# Patient Record
Sex: Male | Born: 1940 | Race: White | Hispanic: No | Marital: Married | Smoking: Former smoker
Health system: Southern US, Community
[De-identification: ages and names within clinical notes are randomized; demographics above are authoritative.]

## PROBLEM LIST (undated history)

## (undated) DIAGNOSIS — E785 Hyperlipidemia, unspecified: Secondary | ICD-10-CM

## (undated) DIAGNOSIS — I10 Essential (primary) hypertension: Secondary | ICD-10-CM

## (undated) HISTORY — PX: HERNIA REPAIR: SHX51

---

## 2001-06-18 ENCOUNTER — Encounter: Payer: Self-pay | Admitting: Emergency Medicine

## 2001-06-18 ENCOUNTER — Emergency Department (HOSPITAL_COMMUNITY): Admission: EM | Admit: 2001-06-18 | Discharge: 2001-06-18 | Payer: Self-pay | Admitting: Emergency Medicine

## 2009-03-06 ENCOUNTER — Encounter: Admission: RE | Admit: 2009-03-06 | Discharge: 2009-03-06 | Payer: Self-pay | Admitting: Endocrinology

## 2010-09-03 NOTE — Consult Note (Signed)
[place]. [facility]  Patient:    Caleb Johnson, Caleb Johnson Visit Number: [id number] MRN: [id number]          Service Type: EMS Location: [place] Attending Physician:  Devoria Albe Dictated by:   Artist Pais Mina Marble, M.D. Proc. Date: 06/18/01 Admit Date:  06/18/2001                            Consultation Report  REQUESTING PHYSICIAN:  Ward Givens, M.D.  REASON FOR CONSULTATION:  Caleb Johnson is a 70 year old right-hand dominant male who presents today with acute amputations of his long ring finger on his dominant right hand status post moving some large logs with a rope wrapped around a lawnmower engine.  He is an otherwise fairly healthy 70 year old male.  ALLERGIES:  No known drug allergies.  CURRENT MEDICATIONS:  Toprol, Monopril, Pravachol, ______, aspirin q.d.  PAST MEDICAL HISTORY:  He is status post a heart attack in 1996.  He did not have any surgery.  No recent hospitalizations or surgery.  FAMILY HISTORY:  Significant for heart disease and hypertension.  SOCIAL HISTORY:  Noncontributory.  PHYSICAL EXAMINATION  GENERAL:  Well-developed, well-nourished male.  Pleasant, alert x3.  EXTREMITIES:  Hand on the right:  He has obvious amputations to the distal aspect of his long and ring finger.  His long finger at the DIP level amputation.  His ring finger is partial amputation through the nail plate and nail bed with loss of the nail plate.  The rest of his hand examination is normal.  LABORATORIES:  X-rays show the above mentioned amputation.  PROCEDURE:  The patient was anesthetized in the emergency department using 2% plain lidocaine. His right hand was then prepped and draped in the usual sterile fashion.  Once this was done revision of the amputations with volar V-Y flaps were undertaken for both the long and ring fingers on his right hand to cover the exposed distal phalangeal bone.  Patient was then dressed with Xeroform, 4 x 4s, Coban wrap.  Was  discharged from the emergency department with Keflex 500 mg one p.o. q.i.d. for a week of antibiotic prophylaxis, Vicodin for pain.  Follow up in my office in five to seven days. Dictated by:   Artist Pais Mina Marble, M.D. Attending Physician:  Devoria Albe DD:  06/18/01 TD:  06/19/01 Job: [id number]

## 2011-06-21 DIAGNOSIS — E782 Mixed hyperlipidemia: Secondary | ICD-10-CM | POA: Diagnosis not present

## 2011-06-21 DIAGNOSIS — I2510 Atherosclerotic heart disease of native coronary artery without angina pectoris: Secondary | ICD-10-CM | POA: Diagnosis not present

## 2011-06-21 DIAGNOSIS — E669 Obesity, unspecified: Secondary | ICD-10-CM | POA: Diagnosis not present

## 2011-06-21 DIAGNOSIS — I10 Essential (primary) hypertension: Secondary | ICD-10-CM | POA: Diagnosis not present

## 2011-06-28 DIAGNOSIS — R7309 Other abnormal glucose: Secondary | ICD-10-CM | POA: Diagnosis not present

## 2011-09-28 DIAGNOSIS — E119 Type 2 diabetes mellitus without complications: Secondary | ICD-10-CM | POA: Diagnosis not present

## 2011-10-27 DIAGNOSIS — D485 Neoplasm of uncertain behavior of skin: Secondary | ICD-10-CM | POA: Diagnosis not present

## 2011-12-27 DIAGNOSIS — R7301 Impaired fasting glucose: Secondary | ICD-10-CM | POA: Diagnosis not present

## 2011-12-27 DIAGNOSIS — E663 Overweight: Secondary | ICD-10-CM | POA: Diagnosis not present

## 2011-12-27 DIAGNOSIS — H9190 Unspecified hearing loss, unspecified ear: Secondary | ICD-10-CM | POA: Diagnosis not present

## 2011-12-27 DIAGNOSIS — E782 Mixed hyperlipidemia: Secondary | ICD-10-CM | POA: Diagnosis not present

## 2011-12-27 DIAGNOSIS — I10 Essential (primary) hypertension: Secondary | ICD-10-CM | POA: Diagnosis not present

## 2011-12-27 DIAGNOSIS — I2510 Atherosclerotic heart disease of native coronary artery without angina pectoris: Secondary | ICD-10-CM | POA: Diagnosis not present

## 2011-12-27 DIAGNOSIS — Z0000 Encounter for general adult medical examination without abnormal findings: Secondary | ICD-10-CM | POA: Diagnosis not present

## 2012-01-03 DIAGNOSIS — R0602 Shortness of breath: Secondary | ICD-10-CM | POA: Diagnosis not present

## 2012-01-03 DIAGNOSIS — Z125 Encounter for screening for malignant neoplasm of prostate: Secondary | ICD-10-CM | POA: Diagnosis not present

## 2012-06-26 DIAGNOSIS — I10 Essential (primary) hypertension: Secondary | ICD-10-CM | POA: Diagnosis not present

## 2012-06-26 DIAGNOSIS — E782 Mixed hyperlipidemia: Secondary | ICD-10-CM | POA: Diagnosis not present

## 2012-06-26 DIAGNOSIS — R7301 Impaired fasting glucose: Secondary | ICD-10-CM | POA: Diagnosis not present

## 2012-06-26 DIAGNOSIS — E663 Overweight: Secondary | ICD-10-CM | POA: Diagnosis not present

## 2012-06-26 DIAGNOSIS — G479 Sleep disorder, unspecified: Secondary | ICD-10-CM | POA: Diagnosis not present

## 2012-12-31 DIAGNOSIS — E119 Type 2 diabetes mellitus without complications: Secondary | ICD-10-CM | POA: Diagnosis not present

## 2012-12-31 DIAGNOSIS — E782 Mixed hyperlipidemia: Secondary | ICD-10-CM | POA: Diagnosis not present

## 2012-12-31 DIAGNOSIS — Z23 Encounter for immunization: Secondary | ICD-10-CM | POA: Diagnosis not present

## 2012-12-31 DIAGNOSIS — I10 Essential (primary) hypertension: Secondary | ICD-10-CM | POA: Diagnosis not present

## 2012-12-31 DIAGNOSIS — Z0000 Encounter for general adult medical examination without abnormal findings: Secondary | ICD-10-CM | POA: Diagnosis not present

## 2013-07-01 DIAGNOSIS — E782 Mixed hyperlipidemia: Secondary | ICD-10-CM | POA: Diagnosis not present

## 2013-07-01 DIAGNOSIS — I10 Essential (primary) hypertension: Secondary | ICD-10-CM | POA: Diagnosis not present

## 2013-07-01 DIAGNOSIS — R062 Wheezing: Secondary | ICD-10-CM | POA: Diagnosis not present

## 2013-07-01 DIAGNOSIS — R7301 Impaired fasting glucose: Secondary | ICD-10-CM | POA: Diagnosis not present

## 2013-07-01 DIAGNOSIS — I2510 Atherosclerotic heart disease of native coronary artery without angina pectoris: Secondary | ICD-10-CM | POA: Diagnosis not present

## 2014-01-09 DIAGNOSIS — E663 Overweight: Secondary | ICD-10-CM | POA: Diagnosis not present

## 2014-01-09 DIAGNOSIS — Z0000 Encounter for general adult medical examination without abnormal findings: Secondary | ICD-10-CM | POA: Diagnosis not present

## 2014-01-09 DIAGNOSIS — Z125 Encounter for screening for malignant neoplasm of prostate: Secondary | ICD-10-CM | POA: Diagnosis not present

## 2014-01-09 DIAGNOSIS — E119 Type 2 diabetes mellitus without complications: Secondary | ICD-10-CM | POA: Diagnosis not present

## 2014-01-09 DIAGNOSIS — Z23 Encounter for immunization: Secondary | ICD-10-CM | POA: Diagnosis not present

## 2014-01-09 DIAGNOSIS — Z1331 Encounter for screening for depression: Secondary | ICD-10-CM | POA: Diagnosis not present

## 2014-01-09 DIAGNOSIS — I10 Essential (primary) hypertension: Secondary | ICD-10-CM | POA: Diagnosis not present

## 2014-01-09 DIAGNOSIS — E782 Mixed hyperlipidemia: Secondary | ICD-10-CM | POA: Diagnosis not present

## 2014-01-09 DIAGNOSIS — I2510 Atherosclerotic heart disease of native coronary artery without angina pectoris: Secondary | ICD-10-CM | POA: Diagnosis not present

## 2014-07-10 DIAGNOSIS — E663 Overweight: Secondary | ICD-10-CM | POA: Diagnosis not present

## 2014-07-10 DIAGNOSIS — E782 Mixed hyperlipidemia: Secondary | ICD-10-CM | POA: Diagnosis not present

## 2014-07-10 DIAGNOSIS — E139 Other specified diabetes mellitus without complications: Secondary | ICD-10-CM | POA: Diagnosis not present

## 2014-07-10 DIAGNOSIS — I10 Essential (primary) hypertension: Secondary | ICD-10-CM | POA: Diagnosis not present

## 2014-07-10 DIAGNOSIS — Z6841 Body Mass Index (BMI) 40.0 and over, adult: Secondary | ICD-10-CM | POA: Diagnosis not present

## 2014-07-10 DIAGNOSIS — I2510 Atherosclerotic heart disease of native coronary artery without angina pectoris: Secondary | ICD-10-CM | POA: Diagnosis not present

## 2014-07-28 DIAGNOSIS — L820 Inflamed seborrheic keratosis: Secondary | ICD-10-CM | POA: Diagnosis not present

## 2014-09-08 DIAGNOSIS — D1801 Hemangioma of skin and subcutaneous tissue: Secondary | ICD-10-CM | POA: Diagnosis not present

## 2014-09-08 DIAGNOSIS — L820 Inflamed seborrheic keratosis: Secondary | ICD-10-CM | POA: Diagnosis not present

## 2014-09-08 DIAGNOSIS — L821 Other seborrheic keratosis: Secondary | ICD-10-CM | POA: Diagnosis not present

## 2014-09-08 DIAGNOSIS — D225 Melanocytic nevi of trunk: Secondary | ICD-10-CM | POA: Diagnosis not present

## 2015-02-20 DIAGNOSIS — E663 Overweight: Secondary | ICD-10-CM | POA: Diagnosis not present

## 2015-02-20 DIAGNOSIS — E782 Mixed hyperlipidemia: Secondary | ICD-10-CM | POA: Diagnosis not present

## 2015-02-20 DIAGNOSIS — Z1389 Encounter for screening for other disorder: Secondary | ICD-10-CM | POA: Diagnosis not present

## 2015-02-20 DIAGNOSIS — I2510 Atherosclerotic heart disease of native coronary artery without angina pectoris: Secondary | ICD-10-CM | POA: Diagnosis not present

## 2015-02-20 DIAGNOSIS — Z23 Encounter for immunization: Secondary | ICD-10-CM | POA: Diagnosis not present

## 2015-02-20 DIAGNOSIS — I10 Essential (primary) hypertension: Secondary | ICD-10-CM | POA: Diagnosis not present

## 2015-02-20 DIAGNOSIS — Z0000 Encounter for general adult medical examination without abnormal findings: Secondary | ICD-10-CM | POA: Diagnosis not present

## 2015-02-20 DIAGNOSIS — Z6841 Body Mass Index (BMI) 40.0 and over, adult: Secondary | ICD-10-CM | POA: Diagnosis not present

## 2015-02-20 DIAGNOSIS — E139 Other specified diabetes mellitus without complications: Secondary | ICD-10-CM | POA: Diagnosis not present

## 2015-08-26 DIAGNOSIS — I10 Essential (primary) hypertension: Secondary | ICD-10-CM | POA: Diagnosis not present

## 2015-08-26 DIAGNOSIS — E663 Overweight: Secondary | ICD-10-CM | POA: Diagnosis not present

## 2015-08-26 DIAGNOSIS — E139 Other specified diabetes mellitus without complications: Secondary | ICD-10-CM | POA: Diagnosis not present

## 2015-08-26 DIAGNOSIS — I2510 Atherosclerotic heart disease of native coronary artery without angina pectoris: Secondary | ICD-10-CM | POA: Diagnosis not present

## 2015-08-26 DIAGNOSIS — Z6841 Body Mass Index (BMI) 40.0 and over, adult: Secondary | ICD-10-CM | POA: Diagnosis not present

## 2015-08-26 DIAGNOSIS — E782 Mixed hyperlipidemia: Secondary | ICD-10-CM | POA: Diagnosis not present

## 2015-09-08 DIAGNOSIS — D225 Melanocytic nevi of trunk: Secondary | ICD-10-CM | POA: Diagnosis not present

## 2015-09-08 DIAGNOSIS — L812 Freckles: Secondary | ICD-10-CM | POA: Diagnosis not present

## 2015-09-08 DIAGNOSIS — L821 Other seborrheic keratosis: Secondary | ICD-10-CM | POA: Diagnosis not present

## 2015-09-08 DIAGNOSIS — L570 Actinic keratosis: Secondary | ICD-10-CM | POA: Diagnosis not present

## 2016-04-04 DIAGNOSIS — H524 Presbyopia: Secondary | ICD-10-CM | POA: Diagnosis not present

## 2016-04-04 DIAGNOSIS — H2513 Age-related nuclear cataract, bilateral: Secondary | ICD-10-CM | POA: Diagnosis not present

## 2016-09-06 DIAGNOSIS — L821 Other seborrheic keratosis: Secondary | ICD-10-CM | POA: Diagnosis not present

## 2016-09-06 DIAGNOSIS — L570 Actinic keratosis: Secondary | ICD-10-CM | POA: Diagnosis not present

## 2016-09-06 DIAGNOSIS — D225 Melanocytic nevi of trunk: Secondary | ICD-10-CM | POA: Diagnosis not present

## 2016-12-15 DIAGNOSIS — E663 Overweight: Secondary | ICD-10-CM | POA: Diagnosis not present

## 2016-12-15 DIAGNOSIS — Z6841 Body Mass Index (BMI) 40.0 and over, adult: Secondary | ICD-10-CM | POA: Diagnosis not present

## 2016-12-15 DIAGNOSIS — I10 Essential (primary) hypertension: Secondary | ICD-10-CM | POA: Diagnosis not present

## 2016-12-15 DIAGNOSIS — R7301 Impaired fasting glucose: Secondary | ICD-10-CM | POA: Diagnosis not present

## 2016-12-15 DIAGNOSIS — I2510 Atherosclerotic heart disease of native coronary artery without angina pectoris: Secondary | ICD-10-CM | POA: Diagnosis not present

## 2016-12-15 DIAGNOSIS — E7800 Pure hypercholesterolemia, unspecified: Secondary | ICD-10-CM | POA: Diagnosis not present

## 2017-04-06 DIAGNOSIS — H25013 Cortical age-related cataract, bilateral: Secondary | ICD-10-CM | POA: Diagnosis not present

## 2017-04-06 DIAGNOSIS — H2513 Age-related nuclear cataract, bilateral: Secondary | ICD-10-CM | POA: Diagnosis not present

## 2017-08-18 DIAGNOSIS — I2510 Atherosclerotic heart disease of native coronary artery without angina pectoris: Secondary | ICD-10-CM | POA: Diagnosis not present

## 2017-08-18 DIAGNOSIS — Z0000 Encounter for general adult medical examination without abnormal findings: Secondary | ICD-10-CM | POA: Diagnosis not present

## 2017-08-18 DIAGNOSIS — E1169 Type 2 diabetes mellitus with other specified complication: Secondary | ICD-10-CM | POA: Diagnosis not present

## 2017-08-18 DIAGNOSIS — Z6841 Body Mass Index (BMI) 40.0 and over, adult: Secondary | ICD-10-CM | POA: Diagnosis not present

## 2017-08-18 DIAGNOSIS — E7800 Pure hypercholesterolemia, unspecified: Secondary | ICD-10-CM | POA: Diagnosis not present

## 2017-08-18 DIAGNOSIS — I10 Essential (primary) hypertension: Secondary | ICD-10-CM | POA: Diagnosis not present

## 2017-08-18 DIAGNOSIS — Z1389 Encounter for screening for other disorder: Secondary | ICD-10-CM | POA: Diagnosis not present

## 2017-09-05 DIAGNOSIS — L814 Other melanin hyperpigmentation: Secondary | ICD-10-CM | POA: Diagnosis not present

## 2017-09-05 DIAGNOSIS — L820 Inflamed seborrheic keratosis: Secondary | ICD-10-CM | POA: Diagnosis not present

## 2017-09-05 DIAGNOSIS — D225 Melanocytic nevi of trunk: Secondary | ICD-10-CM | POA: Diagnosis not present

## 2017-09-05 DIAGNOSIS — L853 Xerosis cutis: Secondary | ICD-10-CM | POA: Diagnosis not present

## 2017-09-05 DIAGNOSIS — L570 Actinic keratosis: Secondary | ICD-10-CM | POA: Diagnosis not present

## 2018-02-05 DIAGNOSIS — Z23 Encounter for immunization: Secondary | ICD-10-CM | POA: Diagnosis not present

## 2018-02-21 DIAGNOSIS — E1169 Type 2 diabetes mellitus with other specified complication: Secondary | ICD-10-CM | POA: Diagnosis not present

## 2018-02-21 DIAGNOSIS — E7800 Pure hypercholesterolemia, unspecified: Secondary | ICD-10-CM | POA: Diagnosis not present

## 2018-02-21 DIAGNOSIS — I2510 Atherosclerotic heart disease of native coronary artery without angina pectoris: Secondary | ICD-10-CM | POA: Diagnosis not present

## 2018-04-09 DIAGNOSIS — H25013 Cortical age-related cataract, bilateral: Secondary | ICD-10-CM | POA: Diagnosis not present

## 2018-04-09 DIAGNOSIS — H2513 Age-related nuclear cataract, bilateral: Secondary | ICD-10-CM | POA: Diagnosis not present

## 2019-04-22 DIAGNOSIS — H25813 Combined forms of age-related cataract, bilateral: Secondary | ICD-10-CM | POA: Diagnosis not present

## 2019-04-22 DIAGNOSIS — H04123 Dry eye syndrome of bilateral lacrimal glands: Secondary | ICD-10-CM | POA: Diagnosis not present

## 2019-05-17 DIAGNOSIS — I10 Essential (primary) hypertension: Secondary | ICD-10-CM | POA: Diagnosis not present

## 2019-05-17 DIAGNOSIS — E1169 Type 2 diabetes mellitus with other specified complication: Secondary | ICD-10-CM | POA: Diagnosis not present

## 2019-05-17 DIAGNOSIS — E7800 Pure hypercholesterolemia, unspecified: Secondary | ICD-10-CM | POA: Diagnosis not present

## 2019-05-17 DIAGNOSIS — I2510 Atherosclerotic heart disease of native coronary artery without angina pectoris: Secondary | ICD-10-CM | POA: Diagnosis not present

## 2019-11-19 DIAGNOSIS — Z7984 Long term (current) use of oral hypoglycemic drugs: Secondary | ICD-10-CM | POA: Diagnosis not present

## 2019-11-19 DIAGNOSIS — E1169 Type 2 diabetes mellitus with other specified complication: Secondary | ICD-10-CM | POA: Diagnosis not present

## 2019-11-19 DIAGNOSIS — I2510 Atherosclerotic heart disease of native coronary artery without angina pectoris: Secondary | ICD-10-CM | POA: Diagnosis not present

## 2019-11-19 DIAGNOSIS — E7800 Pure hypercholesterolemia, unspecified: Secondary | ICD-10-CM | POA: Diagnosis not present

## 2019-11-19 DIAGNOSIS — I10 Essential (primary) hypertension: Secondary | ICD-10-CM | POA: Diagnosis not present

## 2020-04-21 DIAGNOSIS — H04123 Dry eye syndrome of bilateral lacrimal glands: Secondary | ICD-10-CM | POA: Diagnosis not present

## 2020-04-21 DIAGNOSIS — H25813 Combined forms of age-related cataract, bilateral: Secondary | ICD-10-CM | POA: Diagnosis not present

## 2020-06-16 DIAGNOSIS — Z0000 Encounter for general adult medical examination without abnormal findings: Secondary | ICD-10-CM | POA: Diagnosis not present

## 2020-06-16 DIAGNOSIS — I2510 Atherosclerotic heart disease of native coronary artery without angina pectoris: Secondary | ICD-10-CM | POA: Diagnosis not present

## 2020-06-16 DIAGNOSIS — Z1389 Encounter for screening for other disorder: Secondary | ICD-10-CM | POA: Diagnosis not present

## 2020-06-16 DIAGNOSIS — E1165 Type 2 diabetes mellitus with hyperglycemia: Secondary | ICD-10-CM | POA: Diagnosis not present

## 2020-06-16 DIAGNOSIS — I10 Essential (primary) hypertension: Secondary | ICD-10-CM | POA: Diagnosis not present

## 2020-06-16 DIAGNOSIS — Z7984 Long term (current) use of oral hypoglycemic drugs: Secondary | ICD-10-CM | POA: Diagnosis not present

## 2020-06-16 DIAGNOSIS — E7800 Pure hypercholesterolemia, unspecified: Secondary | ICD-10-CM | POA: Diagnosis not present

## 2020-12-15 DIAGNOSIS — Z7984 Long term (current) use of oral hypoglycemic drugs: Secondary | ICD-10-CM | POA: Diagnosis not present

## 2020-12-15 DIAGNOSIS — I2510 Atherosclerotic heart disease of native coronary artery without angina pectoris: Secondary | ICD-10-CM | POA: Diagnosis not present

## 2020-12-15 DIAGNOSIS — E1169 Type 2 diabetes mellitus with other specified complication: Secondary | ICD-10-CM | POA: Diagnosis not present

## 2020-12-15 DIAGNOSIS — I10 Essential (primary) hypertension: Secondary | ICD-10-CM | POA: Diagnosis not present

## 2020-12-15 DIAGNOSIS — E7800 Pure hypercholesterolemia, unspecified: Secondary | ICD-10-CM | POA: Diagnosis not present

## 2020-12-29 DIAGNOSIS — H2513 Age-related nuclear cataract, bilateral: Secondary | ICD-10-CM | POA: Diagnosis not present

## 2020-12-29 DIAGNOSIS — H04122 Dry eye syndrome of left lacrimal gland: Secondary | ICD-10-CM | POA: Diagnosis not present

## 2020-12-29 DIAGNOSIS — H04121 Dry eye syndrome of right lacrimal gland: Secondary | ICD-10-CM | POA: Diagnosis not present

## 2021-02-25 DIAGNOSIS — H04121 Dry eye syndrome of right lacrimal gland: Secondary | ICD-10-CM | POA: Diagnosis not present

## 2021-03-04 DIAGNOSIS — Z23 Encounter for immunization: Secondary | ICD-10-CM | POA: Diagnosis not present

## 2021-04-22 DIAGNOSIS — H2513 Age-related nuclear cataract, bilateral: Secondary | ICD-10-CM | POA: Diagnosis not present

## 2021-05-12 DIAGNOSIS — H25811 Combined forms of age-related cataract, right eye: Secondary | ICD-10-CM | POA: Diagnosis not present

## 2021-05-12 DIAGNOSIS — H2511 Age-related nuclear cataract, right eye: Secondary | ICD-10-CM | POA: Diagnosis not present

## 2021-06-19 ENCOUNTER — Other Ambulatory Visit: Payer: Self-pay

## 2021-06-19 ENCOUNTER — Observation Stay (HOSPITAL_COMMUNITY): Payer: Medicare Other

## 2021-06-19 ENCOUNTER — Observation Stay (HOSPITAL_COMMUNITY)
Admission: EM | Admit: 2021-06-19 | Discharge: 2021-06-20 | Disposition: A | Discharge status: Home / Self Care | Payer: Medicare Other | Attending: Internal Medicine | Admitting: Internal Medicine

## 2021-06-19 ENCOUNTER — Observation Stay (HOSPITAL_BASED_OUTPATIENT_CLINIC_OR_DEPARTMENT_OTHER): Payer: Medicare Other

## 2021-06-19 ENCOUNTER — Emergency Department (HOSPITAL_COMMUNITY): Payer: Medicare Other

## 2021-06-19 ENCOUNTER — Encounter (HOSPITAL_COMMUNITY): Payer: Self-pay

## 2021-06-19 DIAGNOSIS — I2510 Atherosclerotic heart disease of native coronary artery without angina pectoris: Secondary | ICD-10-CM | POA: Diagnosis not present

## 2021-06-19 DIAGNOSIS — E119 Type 2 diabetes mellitus without complications: Secondary | ICD-10-CM | POA: Diagnosis not present

## 2021-06-19 DIAGNOSIS — G459 Transient cerebral ischemic attack, unspecified: Secondary | ICD-10-CM

## 2021-06-19 DIAGNOSIS — I639 Cerebral infarction, unspecified: Secondary | ICD-10-CM | POA: Diagnosis not present

## 2021-06-19 DIAGNOSIS — I671 Cerebral aneurysm, nonruptured: Secondary | ICD-10-CM | POA: Diagnosis present

## 2021-06-19 DIAGNOSIS — E66813 Obesity, class 3: Secondary | ICD-10-CM | POA: Diagnosis present

## 2021-06-19 DIAGNOSIS — Z79899 Other long term (current) drug therapy: Secondary | ICD-10-CM | POA: Diagnosis not present

## 2021-06-19 DIAGNOSIS — E785 Hyperlipidemia, unspecified: Secondary | ICD-10-CM | POA: Diagnosis not present

## 2021-06-19 DIAGNOSIS — E7800 Pure hypercholesterolemia, unspecified: Secondary | ICD-10-CM

## 2021-06-19 DIAGNOSIS — Z7982 Long term (current) use of aspirin: Secondary | ICD-10-CM | POA: Insufficient documentation

## 2021-06-19 DIAGNOSIS — I10 Essential (primary) hypertension: Secondary | ICD-10-CM | POA: Diagnosis not present

## 2021-06-19 DIAGNOSIS — G319 Degenerative disease of nervous system, unspecified: Secondary | ICD-10-CM | POA: Diagnosis not present

## 2021-06-19 DIAGNOSIS — Z20822 Contact with and (suspected) exposure to covid-19: Secondary | ICD-10-CM | POA: Insufficient documentation

## 2021-06-19 DIAGNOSIS — Z7984 Long term (current) use of oral hypoglycemic drugs: Secondary | ICD-10-CM | POA: Diagnosis not present

## 2021-06-19 DIAGNOSIS — R202 Paresthesia of skin: Secondary | ICD-10-CM

## 2021-06-19 DIAGNOSIS — R200 Anesthesia of skin: Secondary | ICD-10-CM | POA: Diagnosis present

## 2021-06-19 DIAGNOSIS — R29818 Other symptoms and signs involving the nervous system: Secondary | ICD-10-CM | POA: Diagnosis not present

## 2021-06-19 HISTORY — DX: Essential (primary) hypertension: I10

## 2021-06-19 HISTORY — DX: Morbid (severe) obesity due to excess calories: E66.01

## 2021-06-19 HISTORY — DX: Hyperlipidemia, unspecified: E78.5

## 2021-06-19 LAB — I-STAT CHEM 8, ED
BUN: 17 mg/dL (ref 8–23)
Calcium, Ion: 1.11 mmol/L — ABNORMAL LOW (ref 1.15–1.40)
Chloride: 101 mmol/L (ref 98–111)
Creatinine, Ser: 0.9 mg/dL (ref 0.61–1.24)
Glucose, Bld: 175 mg/dL — ABNORMAL HIGH (ref 70–99)
HCT: 44 % (ref 39.0–52.0)
Hemoglobin: 15 g/dL (ref 13.0–17.0)
Potassium: 4 mmol/L (ref 3.5–5.1)
Sodium: 139 mmol/L (ref 135–145)
TCO2: 29 mmol/L (ref 22–32)

## 2021-06-19 LAB — RESP PANEL BY RT-PCR (FLU A&B, COVID) ARPGX2
Influenza A by PCR: NEGATIVE
Influenza B by PCR: NEGATIVE
SARS Coronavirus 2 by RT PCR: NEGATIVE

## 2021-06-19 LAB — ECHOCARDIOGRAM COMPLETE
AR max vel: 2.73 cm2
AV Area VTI: 2.69 cm2
AV Area mean vel: 2.68 cm2
AV Mean grad: 4 mmHg
AV Peak grad: 7.4 mmHg
Ao pk vel: 1.36 m/s
Area-P 1/2: 3.3 cm2
Height: 67 in
S' Lateral: 3.7 cm
Weight: 4211.67 oz

## 2021-06-19 LAB — HEMOGLOBIN A1C
Hgb A1c MFr Bld: 5.8 % — ABNORMAL HIGH (ref 4.8–5.6)
Mean Plasma Glucose: 119.76 mg/dL

## 2021-06-19 LAB — CBC
HCT: 43.8 % (ref 39.0–52.0)
Hemoglobin: 13.8 g/dL (ref 13.0–17.0)
MCH: 29.6 pg (ref 26.0–34.0)
MCHC: 31.5 g/dL (ref 30.0–36.0)
MCV: 94 fL (ref 80.0–100.0)
Platelets: 224 10*3/uL (ref 150–400)
RBC: 4.66 MIL/uL (ref 4.22–5.81)
RDW: 14.1 % (ref 11.5–15.5)
WBC: 8.5 10*3/uL (ref 4.0–10.5)
nRBC: 0 % (ref 0.0–0.2)

## 2021-06-19 LAB — COMPREHENSIVE METABOLIC PANEL
ALT: 20 U/L (ref 0–44)
AST: 21 U/L (ref 15–41)
Albumin: 3.3 g/dL — ABNORMAL LOW (ref 3.5–5.0)
Alkaline Phosphatase: 95 U/L (ref 38–126)
Anion gap: 9 (ref 5–15)
BUN: 16 mg/dL (ref 8–23)
CO2: 27 mmol/L (ref 22–32)
Calcium: 8.8 mg/dL — ABNORMAL LOW (ref 8.9–10.3)
Chloride: 101 mmol/L (ref 98–111)
Creatinine, Ser: 0.93 mg/dL (ref 0.61–1.24)
GFR, Estimated: >60 mL/min (ref >60)
Glucose, Bld: 182 mg/dL — ABNORMAL HIGH (ref 70–99)
Potassium: 3.9 mmol/L (ref 3.5–5.1)
Sodium: 137 mmol/L (ref 135–145)
Total Bilirubin: 0.7 mg/dL (ref 0.3–1.2)
Total Protein: 7 g/dL (ref 6.5–8.1)

## 2021-06-19 LAB — DIFFERENTIAL
Abs Immature Granulocytes: 0.04 10*3/uL (ref 0.00–0.07)
Basophils Absolute: 0 10*3/uL (ref 0.0–0.1)
Basophils Relative: 0 %
Eosinophils Absolute: 0.5 10*3/uL (ref 0.0–0.5)
Eosinophils Relative: 5 %
Immature Granulocytes: 1 %
Lymphocytes Relative: 20 %
Lymphs Abs: 1.7 10*3/uL (ref 0.7–4.0)
Monocytes Absolute: 0.7 10*3/uL (ref 0.1–1.0)
Monocytes Relative: 9 %
Neutro Abs: 5.6 10*3/uL (ref 1.7–7.7)
Neutrophils Relative %: 65 %

## 2021-06-19 LAB — CBG MONITORING, ED: Glucose-Capillary: 186 mg/dL — ABNORMAL HIGH (ref 70–99)

## 2021-06-19 LAB — PROTIME-INR
INR: 1.1 (ref 0.8–1.2)
Prothrombin Time: 14.2 seconds (ref 11.4–15.2)

## 2021-06-19 LAB — APTT: aPTT: 30 seconds (ref 24–36)

## 2021-06-19 MED ORDER — ENOXAPARIN SODIUM 40 MG/0.4ML IJ SOSY
40.0000 mg | PREFILLED_SYRINGE | INTRAMUSCULAR | Status: DC
  Administered 2021-06-19: 40 mg via SUBCUTANEOUS
  Filled 2021-06-19: qty 0.4

## 2021-06-19 MED ORDER — ASPIRIN 81 MG PO CHEW: 81.0000 mg | CHEWABLE_TABLET | Freq: Once | ORAL | Status: DC

## 2021-06-19 MED ORDER — ACETAMINOPHEN 160 MG/5ML PO SOLN: 650.0000 mg | ORAL | Status: DC | PRN

## 2021-06-19 MED ORDER — CLOPIDOGREL BISULFATE 75 MG PO TABS
150.0000 mg | ORAL_TABLET | Freq: Once | ORAL | Status: AC
  Administered 2021-06-19: 150 mg via ORAL
  Filled 2021-06-19: qty 2

## 2021-06-19 MED ORDER — ASPIRIN EC 81 MG PO TBEC
81.0000 mg | DELAYED_RELEASE_TABLET | Freq: Every day | ORAL | Status: DC
  Administered 2021-06-20: 81 mg via ORAL
  Filled 2021-06-19: qty 1

## 2021-06-19 MED ORDER — ROSUVASTATIN CALCIUM 20 MG PO TABS
20.0000 mg | ORAL_TABLET | Freq: Every day | ORAL | Status: DC
  Administered 2021-06-20: 20 mg via ORAL
  Filled 2021-06-19: qty 1

## 2021-06-19 MED ORDER — CLOPIDOGREL BISULFATE 75 MG PO TABS
75.0000 mg | ORAL_TABLET | Freq: Every day | ORAL | Status: DC
  Administered 2021-06-20: 75 mg via ORAL
  Filled 2021-06-19: qty 1

## 2021-06-19 MED ORDER — PERFLUTREN LIPID MICROSPHERE
1.0000 mL | INTRAVENOUS | Status: AC | PRN
  Administered 2021-06-19: 2 mL via INTRAVENOUS
  Filled 2021-06-19: qty 10

## 2021-06-19 MED ORDER — ACETAMINOPHEN 650 MG RE SUPP: 650.0000 mg | RECTAL | Status: DC | PRN

## 2021-06-19 MED ORDER — SODIUM CHLORIDE 0.9 % IV SOLN: Freq: Once | INTRAVENOUS | Status: AC

## 2021-06-19 MED ORDER — HYDRALAZINE HCL 20 MG/ML IJ SOLN: 10.0000 mg | INTRAMUSCULAR | Status: DC | PRN

## 2021-06-19 MED ORDER — ACETAMINOPHEN 325 MG PO TABS: 650.0000 mg | ORAL_TABLET | ORAL | Status: DC | PRN

## 2021-06-19 MED ORDER — STROKE: EARLY STAGES OF RECOVERY BOOK: Freq: Once | Status: DC

## 2021-06-19 MED ORDER — SODIUM CHLORIDE 0.9% FLUSH
3.0000 mL | Freq: Once | INTRAVENOUS | Status: AC
  Administered 2021-06-19: 3 mL via INTRAVENOUS

## 2021-06-19 NOTE — Assessment & Plan Note (Addendum)
BMI 41.25 kg/m2.  Discussed with patient needs for aggressive lifestyle changes/weight loss as this complicates all facets of care.  Outpatient follow-up with PCP.   ?

## 2021-06-19 NOTE — Progress Notes (Signed)
Carotid artery duplex has been completed. ?Preliminary results can be found in CV Proc through chart review.  ? ?06/19/21 4:28 PM ?Carlos Levering RVT   ?

## 2021-06-19 NOTE — ED Notes (Signed)
Pt CT with this RN ?

## 2021-06-19 NOTE — ED Provider Notes (Signed)
[facility] EMERGENCY DEPARTMENT Provider Note   CSN: [id number] Arrival date & time: 06/19/21  1144     History  Chief Complaint  Patient presents with   Code Stroke    Caleb Johnson is a 81 y.o. male.  Patient with acute onset left arm/leg numbness for the past 2 hours. Indicates when got up today felt fine/normal. Around 10 am noted onset of left arm/leg numbness. States is was hard to walk/move left leg due to the numb/heavy feeling, but was able to walk. No trauma/fall. States since onset, numbness has improved, but not resolved. No headache. No neck or back pain. No radicular pain. No change in speech or vision.   The history is provided by the patient and medical records.      Home Medications Prior to Admission medications   Not on File      Allergies    Patient has no allergy information on record.    Review of Systems   Review of Systems  Constitutional:  Negative for fever.  HENT:  Negative for trouble swallowing.   Eyes:  Negative for visual disturbance.  Respiratory:  Negative for shortness of breath.   Cardiovascular:  Negative for chest pain.  Gastrointestinal:  Negative for abdominal pain, nausea and vomiting.  Genitourinary:  Negative for flank pain.  Musculoskeletal:  Negative for back pain and neck pain.  Skin:  Negative for rash.  Neurological:  Positive for numbness. Negative for speech difficulty and headaches.  Hematological:  Does not bruise/bleed easily.  Psychiatric/Behavioral:  Negative for confusion.    Physical Exam Updated Vital Signs BP 133/73 (BP Location: Left Arm)    Pulse 81    Temp 98.1 F (36.7 C) (Oral)    Resp 16    Wt 119.4 kg    SpO2 99%  Physical Exam Vitals and nursing note reviewed.  Constitutional:      Appearance: Normal appearance. He is well-developed.  HENT:     Head: Atraumatic.     Nose: Nose normal.     Mouth/Throat:     Mouth: Mucous membranes are moist.     Pharynx: Oropharynx is clear.   Eyes:     General: No scleral icterus.    Conjunctiva/sclera: Conjunctivae normal.     Pupils: Pupils are equal, round, and reactive to light.  Neck:     Vascular: No carotid bruit.     Trachea: No tracheal deviation.  Cardiovascular:     Rate and Rhythm: Normal rate and regular rhythm.     Pulses: Normal pulses.     Heart sounds: Normal heart sounds. No murmur heard.   No friction rub. No gallop.  Pulmonary:     Effort: Pulmonary effort is normal. No accessory muscle usage or respiratory distress.     Breath sounds: Normal breath sounds.  Abdominal:     General: There is no distension.     Palpations: Abdomen is soft.     Tenderness: There is no abdominal tenderness.  Genitourinary:    Comments: No cva tenderness. Musculoskeletal:        General: No swelling or tenderness.     Cervical back: Normal range of motion and neck supple. No rigidity.     Comments: CTLS spine, non tender, aligned, no step off.   Skin:    General: Skin is warm and dry.     Findings: No rash.  Neurological:     Mental Status: He is alert.  Comments: Alert, speech clear, no gross dysarthria or aphasia noted. Motor intact bil, stre 5/5. Sens to touch/pressure grossly intact, pt notes subjective mild numbness LLE/LUE.  Psychiatric:        Mood and Affect: Mood normal.    ED Results / Procedures / Treatments   Labs (all labs ordered are listed, but only abnormal results are displayed) Results for orders placed or performed during the hospital encounter of 06/19/21  Protime-INR  Result Value Ref Range   Prothrombin Time 14.2 11.4 - 15.2 seconds   INR 1.1 0.8 - 1.2  APTT  Result Value Ref Range   aPTT 30 24 - 36 seconds  CBC  Result Value Ref Range   WBC 8.5 4.0 - 10.5 K/uL   RBC 4.66 4.22 - 5.81 MIL/uL   Hemoglobin 13.8 13.0 - 17.0 g/dL   HCT 43.8 39.0 - 52.0 %   MCV 94.0 80.0 - 100.0 fL   MCH 29.6 26.0 - 34.0 pg   MCHC 31.5 30.0 - 36.0 g/dL   RDW 14.1 11.5 - 15.5 %   Platelets 224 150  - 400 K/uL   nRBC 0.0 0.0 - 0.2 %  Differential  Result Value Ref Range   Neutrophils Relative % 65 %   Neutro Abs 5.6 1.7 - 7.7 K/uL   Lymphocytes Relative 20 %   Lymphs Abs 1.7 0.7 - 4.0 K/uL   Monocytes Relative 9 %   Monocytes Absolute 0.7 0.1 - 1.0 K/uL   Eosinophils Relative 5 %   Eosinophils Absolute 0.5 0.0 - 0.5 K/uL   Basophils Relative 0 %   Basophils Absolute 0.0 0.0 - 0.1 K/uL   Immature Granulocytes 1 %   Abs Immature Granulocytes 0.04 0.00 - 0.07 K/uL  Comprehensive metabolic panel  Result Value Ref Range   Sodium 137 135 - 145 mmol/L   Potassium 3.9 3.5 - 5.1 mmol/L   Chloride 101 98 - 111 mmol/L   CO2 27 22 - 32 mmol/L   Glucose, Bld 182 (H) 70 - 99 mg/dL   BUN 16 8 - 23 mg/dL   Creatinine, Ser 0.93 0.61 - 1.24 mg/dL   Calcium 8.8 (L) 8.9 - 10.3 mg/dL   Total Protein 7.0 6.5 - 8.1 g/dL   Albumin 3.3 (L) 3.5 - 5.0 g/dL   AST 21 15 - 41 U/L   ALT 20 0 - 44 U/L   Alkaline Phosphatase 95 38 - 126 U/L   Total Bilirubin 0.7 0.3 - 1.2 mg/dL   GFR, Estimated >60 >60 mL/min   Anion gap 9 5 - 15  I-stat chem 8, ED  Result Value Ref Range   Sodium 139 135 - 145 mmol/L   Potassium 4.0 3.5 - 5.1 mmol/L   Chloride 101 98 - 111 mmol/L   BUN 17 8 - 23 mg/dL   Creatinine, Ser 0.90 0.61 - 1.24 mg/dL   Glucose, Bld 175 (H) 70 - 99 mg/dL   Calcium, Ion 1.11 (L) 1.15 - 1.40 mmol/L   TCO2 29 22 - 32 mmol/L   Hemoglobin 15.0 13.0 - 17.0 g/dL   HCT 44.0 39.0 - 52.0 %  CBG monitoring, ED  Result Value Ref Range   Glucose-Capillary 186 (H) 70 - 99 mg/dL   Comment 1 Notify RN    Comment 2 Document in Chart      EKG EKG Interpretation  Date/Time:  Saturday June 19 2021 12:14:06 EST Ventricular Rate:  74 PR Interval:  211 QRS Duration: 109 QT  Interval:  387 QTC Calculation: 430 R Axis:   -9 Text Interpretation: Sinus rhythm Low voltage, precordial leads LVH with secondary repolarization abnormality Nonspecific T wave abnormality No previous tracing Confirmed by  Lajean Saver [phone]) on 06/19/2021 12:20:54 PM  Radiology CT HEAD CODE STROKE WO CONTRAST  Addendum Date: 06/19/2021   ADDENDUM REPORT: 06/19/2021 12:34 ADDENDUM: Please note, there is a dictation error in the fifth line of the brain findings section. This should read there is no evidence of ACUTE demarcated cortical infarct. Electronically Signed   By: Kellie Simmering D.O.   On: 06/19/2021 12:34   Result Date: 06/19/2021 CLINICAL DATA:  Code stroke. Neuro deficit, acute, stroke suspected. EXAM: CT HEAD WITHOUT CONTRAST TECHNIQUE: Contiguous axial images were obtained from the base of the skull through the vertex without intravenous contrast. RADIATION DOSE REDUCTION: This exam was performed according to the departmental dose-optimization program which includes automated exposure control, adjustment of the mA and/or kV according to patient size and/or use of iterative reconstruction technique. COMPARISON:  No pertinent prior exams available for comparison. FINDINGS: Brain: Mild-to-moderate generalized cerebral atrophy. 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 Small chronic appearing infarct within the left cerebellar hemisphere. There is no acute intracranial hemorrhage. No demarcated cortical infarct. No extra-axial fluid collection. No evidence of an intracranial mass. No midline shift. Vascular: No hyperdense vessel.  Atherosclerotic calcifications. Skull: Normal. Negative for fracture or focal lesion. Sinuses/Orbits: Visualized orbits show no acute finding. No significant paranasal sinus disease at the imaged levels. ASPECTS ([place] Stroke Program Early CT Score) - Ganglionic level infarction (caudate, lentiform nuclei, internal capsule, insula, M1-M3 cortex): 7 - Supraganglionic infarction (M4-M6 cortex): 3 Total score (0-10 with 10 being normal): 10 (when discounting the chronic infarcts) No evidence of acute intracranial mallet. These results were communicated to Dr.  Lorrin Goodell at 12:28 pmon 3/4/2023by text page via the [facility] messaging system. IMPRESSION: No evidence of acute intracranial abnormality. 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 Small chronic appearing infarct within the left cerebellar hemisphere. Mild-to-moderate generalized cerebral atrophy. Electronically Signed: By: Kellie Simmering D.O. On: 06/19/2021 12:28    Procedures Procedures    Medications Ordered in ED Medications  sodium chloride flush (NS) 0.9 % injection 3 mL (has no administration in time range)    ED Course/ Medical Decision Making/ A&P                           Medical Decision Making Problems Addressed: Essential hypertension: chronic illness or injury Hypercholesteremia: chronic illness or injury Paresthesias: acute illness or injury TIA (transient ischemic attack): acute illness or injury with systemic symptoms that poses a threat to life or bodily functions  Amount and/or Complexity of Data Reviewed Independent Historian:     Details: family/additional info/hx External Data Reviewed: notes. Labs: ordered. Decision-making details documented in ED Course. Radiology: ordered and independent interpretation performed. Decision-making details documented in ED Course. ECG/medicine tests: ordered and independent interpretation performed. Decision-making details documented in ED Course.  Risk OTC drugs. Decision regarding hospitalization.   Iv ns. Continuous pulse ox and cardiac monitoring. Labs ordered/sent. Imaging ordered. ECG.   Reviewed nursing notes and prior charts for additional history.  External reports reviewed. Additional hx from:  Labs reviewed/interpreted by me - chem normal.  CT reviewed/interpreted by me - no hem.   Cardiac monitor: sinus rhythm, rate 80.  Neurology consulted. Discussed pt, they agree w mr. They indicate their recommendation  is to admit to medicine for TIA/CVA workup. Hospitalist  consulted for admission.  Recheck pt, earlier symptoms have resolved. No numbness or paresthesias. No weakness.   Asa po. Plavix po.          Final Clinical Impression(s) / ED Diagnoses Final diagnoses:  None    Rx / DC Orders ED Discharge Orders     None         Lajean Saver, MD 06/19/21 1520

## 2021-06-19 NOTE — Consult Note (Signed)
NEUROLOGY CONSULTATION NOTE  ? ?Date of service: June 19, 2021 ?Patient Name: Caleb Johnson ?MRN:  [id number] ?DOB:  [birth year] ?Reason for consult: "L sided numbness" ?Requesting Provider: Drenda Freeze, MD ?_ _ _   _ __   _ __ _ _  __ __   _ __   __ _ ? ?History of Present Illness  ?Caleb Johnson is a 81 y.o. male with PMH significant for HTN who presents with L sided numbness. Symptoms started around 1000. Reports arm went numb, leg was numb and weak. He was just sitting when it started. ? ?No prior hx of similar symptoms, no prior strokes. Symptoms had resolved with residual mild L arm numbness which just went away in the CT scanner. No headache. Does not smoke. Has HTN and high cholsterol ? ?LKW: 1000 ?mRS: 0 ?tNKASE: not offered, symptoms spontaneously resolved. ?Thrombectomy: not offered, symptoms spontaneously resolved. ?NIHSS components Score: Comment  ?1a Level of Conscious 0'[x]'$  1'[]'$  2'[]'$  3'[]'$      ?1b LOC Questions 0'[x]'$  1'[]'$  2'[]'$       ?1c LOC Commands 0'[x]'$  1'[]'$  2'[]'$       ?2 Best Gaze 0'[x]'$  1'[]'$  2'[]'$       ?3 Visual 0'[x]'$  1'[]'$  2'[]'$  3'[]'$      ?4 Facial Palsy 0'[x]'$  1'[]'$  2'[]'$  3'[]'$      ?5a Motor Arm - left 0'[x]'$  1'[]'$  2'[]'$  3'[]'$  4'[]'$  UN'[]'$    ?5b Motor Arm - Right 0'[x]'$  1'[]'$  2'[]'$  3'[]'$  4'[]'$  UN'[]'$    ?6a Motor Leg - Left 0'[x]'$  1'[]'$  2'[]'$  3'[]'$  4'[]'$  UN'[]'$    ?6b Motor Leg - Right 0'[x]'$  1'[]'$  2'[]'$  3'[]'$  4'[]'$  UN'[]'$    ?7 Limb Ataxia 0'[x]'$  1'[]'$  2'[]'$  3'[]'$  UN'[]'$     ?8 Sensory 0'[x]'$  1'[]'$  2'[]'$  UN'[]'$      ?9 Best Language 0'[x]'$  1'[]'$  2'[]'$  3'[]'$      ?10 Dysarthria 0'[x]'$  1'[]'$  2'[]'$  UN'[]'$      ?11 Extinct. and Inattention 0'[x]'$  1'[]'$  2'[]'$       ?TOTAL: 0   ? ? ?  ? ?ROS  ? ?Constitutional Denies weight loss, fever and chills.   ?HEENT Denies changes in vision and hearing.   ?Respiratory Denies SOB and cough.   ?CV Denies palpitations and CP   ?GI Denies abdominal pain, nausea, vomiting and diarrhea.   ?GU Denies dysuria and urinary frequency.   ?MSK Denies myalgia and joint pain.   ?Skin Denies rash and pruritus.   ?Neurological Denies headache and syncope.   ?Psychiatric Denies recent changes in  mood. Denies anxiety and depression.   ? ?Past History  ? ?Past Medical History:  ?Diagnosis Date  ? Hypertension   ? ?History reviewed. No pertinent surgical history. ?History reviewed. No pertinent family history. ?Social History  ? ?Socioeconomic History  ? Marital status: Married  ?  Spouse name: Not on file  ? Number of children: Not on file  ? Years of education: Not on file  ? Highest education level: Not on file  ?Occupational History  ? Not on file  ?Tobacco Use  ? Smoking status: Never  ? Smokeless tobacco: Never  ?Substance and Sexual Activity  ? Alcohol use: Never  ? Drug use: Never  ? Sexual activity: Not on file  ?Other Topics Concern  ? Not on file  ?Social History Narrative  ? Not on file  ? ?Social Determinants of Health  ? ?Financial Resource Strain: Not on file  ?Food Insecurity: Not on file  ?Transportation Needs: Not on file  ?Physical Activity: Not on file  ?Stress: Not on  file  ?Social Connections: Not on file  ? ?No Known Allergies ? ?Medications  ?(Not in a hospital admission) ?  ? ?Vitals  ? ?Vitals:  ? 06/19/21 1300 06/19/21 1330 06/19/21 1400 06/19/21 1430  ?BP: 131/69 133/66 (!) 138/96 (!) 149/66  ?Pulse: 67 (!) 58 (!) 56 (!) 59  ?Resp: (!) 22 (!) 21 (!) 21 20  ?Temp:      ?TempSrc:      ?SpO2: 95% 95% 96% 96%  ?Weight:      ?Height:      ?  ? ?Body mass index is 41.23 kg/m?. ? ?Physical Exam  ? ?General: Laying comfortably in bed; in no acute distress.  ?HENT: Normal oropharynx and mucosa. Normal external appearance of ears and nose.  ?Neck: Supple, no pain or tenderness  ?CV: No JVD. No peripheral edema.  ?Pulmonary: Symmetric Chest rise. Normal respiratory effort.  ?Abdomen: Soft to touch, non-tender.  ?Ext: No cyanosis, edema, or deformity  ?Skin: No rash. Normal palpation of skin.   ?Musculoskeletal: Normal digits and nails by inspection. No clubbing.  ? ?Neurologic Examination  ?Mental status/Cognition: Alert, oriented to self, place, month and year, good attention.   ?Speech/language: Fluent, comprehension intact, object naming intact, repetition intact.  ?Cranial nerves:  ? CN II Pupils equal and reactive to light, no VF deficits   ? CN III,IV,VI EOM intact, no gaze preference or deviation, no nystagmus   ? CN V normal sensation in V1, V2, and V3 segments bilaterally   ? CN VII no asymmetry, no nasolabial fold flattening   ? CN VIII normal hearing to speech   ? CN IX & X normal palatal elevation, no uvular deviation   ? CN XI 5/5 head turn and 5/5 shoulder shrug bilaterally   ? CN XII midline tongue protrusion   ? ?Motor:  ?Muscle bulk: normal, tone normal, pronator drift none tremor none ?Mvmt Root Nerve  Muscle Right Left Comments  ?SA C5/6 Ax Deltoid 5 5   ?EF C5/6 Mc Biceps 5 5   ?EE C6/7/8 Rad Triceps 5 5   ?WF C6/7 Med FCR     ?WE C7/8 PIN ECU     ?F Ab C8/T1 U ADM/FDI 5 5   ?HF L1/2/3 Fem Illopsoas 5 5   ?KE L2/3/4 Fem Quad 5 5   ?DF L4/5 D Peron Tib Ant 5 5   ?PF S1/2 Tibial Grc/Sol 5 5   ? ?Reflexes: ? Right Left Comments  ?Pectoralis     ? Biceps (C5/6) 2 2   ?Brachioradialis (C5/6) 2 2   ? Triceps (C6/7) 2 2   ? Patellar (L3/4) 2 2   ? Achilles (S1)     ? Hoffman     ? Plantar     ?Jaw jerk   ? ?Sensation: ? Light touch Intact throughout  ? Pin prick   ? Temperature   ? Vibration   ?Proprioception   ? ?Coordination/Complex Motor:  ?- Finger to Nose intact BL ?- Heel to shin intact BL ?- Rapid alternating movement intact BL ?- Gait: Stride length normal. Arm swing poor. Base width narrow. ? ?Labs  ? ?CBC:  ?Recent Labs  ?Lab 06/19/21 ?1155 06/19/21 ?1159  ?WBC 8.5  --   ?NEUTROABS 5.6  --   ?HGB 13.8 15.0  ?HCT 43.8 44.0  ?MCV 94.0  --   ?PLT 224  --   ? ? ?Basic Metabolic Panel:  ?Lab Results  ?Component Value Date  ? NA 139 06/19/2021  ?  K 4.0 06/19/2021  ? CO2 27 06/19/2021  ? GLUCOSE 175 (H) 06/19/2021  ? BUN 17 06/19/2021  ? CREATININE 0.90 06/19/2021  ? CALCIUM 8.8 (L) 06/19/2021  ? GFRNONAA >60 06/19/2021  ? ?Lipid Panel: No results found for: [place] ?HgbA1c:  No results found for: HGBA1C ?Urine Drug Screen: No results found for: LABOPIA, COCAINSCRNUR, [place], [place], THCU, LABBARB  ?Alcohol Level No results found for: ETH ? ?CT Head without contrast(Personally reviewed): ?CTH was negative for a large hypodensity concerning for a large territory infarct or hyperdensity concerning for an ICH ? ?Impression  ? ?ANDON VILLARD is a 81 y.o. male with PMH significant for HTN who presents with sudden onset LUE numbness and LLE numbness and heaviness. Symptoms resolved in the ED and thus not offered tNKASE. Symptoms not consistent with a LVO and thus not a candidate for thrombectomy. His neurologic examination is normal and non focal. ? ?Suspect this was a TIA. ? ?Recommendations  ?Plan: ? ?Recommend that primary team order following: ?- Frequent Neuro checks per stroke unit protocol ?- Recommend brain imaging with MRI Brain without contrast ?- Recommend Vascular imaging with MRA Angio Head without contrast and US Carotid doppler ?- Recommend obtaining TTE ?- Recommend obtaining Lipid panel with LDL ?- Please start statin if LDL > 70 ?- Recommend HbA1c ?- Antithrombotic - Aspirin '81mg'$  daily along with plavic '75mg'$  daily x 21 days followed by aspirin '81mg'$  daily alone. ?- Recommend DVT ppx ?- SBP goal - permissive hypertension first 24 h < 220/110. Held home meds.  ?- Recommend Telemetry monitoring for arrythmia ?- Recommend bedside swallow screen prior to PO intake. ?- Stroke education booklet ?- Recommend PT/OT/SLP consult ? ?______________________________________________________________________ ? ? ?Thank you for the opportunity to take part in the care of this patient. If you have any further questions, please contact the neurology consultation attending. ? ?Signed, ? ?Donnetta Simpers ?[facility] ?Pager Number [phone] ?_ _ _   _ __   _ __ _ _  __ __   _ __   __ _ ? ?

## 2021-06-19 NOTE — ED Notes (Signed)
Pt to ECHO

## 2021-06-19 NOTE — Assessment & Plan Note (Addendum)
Lipid panel with total cholesterol 160, HDL 34, LDL 58, triglycerides 242. Continue Crestor 20 mg p.o. daily.  Outpatient follow-up with PCP.  May need to start fenofibrate. ?

## 2021-06-19 NOTE — H&P (Signed)
?History and Physical  ? ? ?Patient: Caleb Johnson OHY:[id number] DOB: [birth year] ?DOA: 06/19/2021 ?DOS: the patient was seen and examined on 06/19/2021 ?PCP: Seward Carol, MD  ?Patient coming from: Home ? ?Chief Complaint:  ?Chief Complaint  ?Patient presents with  ? Code Stroke  ? ? ?HPI: Caleb Johnson is a 81 y.o. male with medical history significant of hypertension, hyperlipidemia, CAD with prior history of MI in 1995, diabetes mellitus type 2,prior tobacco abuse, and morbid obesity who presents with reports of numbness and tingling of his left arm and leg this morning around 10 AM.  Symptoms lasted approximately hour and a half prior to starting to resolve. Denied any HA or change in vision or speech.  ? ?After patient was evaluated in triage and Code stroke activated. CT scan of the brain did not note any acute abnormality, but did note a small chronic appearing of the right parietal lobe and the left cerebellar hemisphere.  Patient was afebrile heart rate 56-68, respirations 16-22, blood pressure 131/68 - 152/75, and O2 saturations maintained. Labs were relatively unremarkable except for glucose of 182.  He was on a TNKAse candidate due to symptoms resolving.  Symptoms did not give concern for large vessel occlusion. Patient had been started on aspirin and Plavix ? ?Review of Systems: As mentioned in the history of present illness. All other systems reviewed and are negative. ?Past Medical History:  ?Diagnosis Date  ? Hypertension   ? ?History reviewed. No pertinent surgical history. ?Social History:  reports that he has never smoked. He has never used smokeless tobacco. He reports that he does not drink alcohol and does not use drugs. ? ?No Known Allergies ? ?History reviewed. No pertinent family history. ? ?Prior to Admission medications   ?Medication Sig Start Date End Date Taking? Authorizing Provider  ?aspirin EC 81 MG tablet Take 81 mg by mouth daily. Swallow whole.   Yes [provider]   ?metFORMIN (GLUCOPHAGE) 500 MG tablet Take 500 mg by mouth daily. 05/08/21  Yes [provider]  ?nebivolol (BYSTOLIC) 10 MG tablet Take 10 mg by mouth daily. 05/24/21  Yes [provider]  ?RESTASIS 0.05 % ophthalmic emulsion Place 1 drop into both eyes 2 (two) times daily. 02/25/21  Yes [provider]  ?rosuvastatin (CRESTOR) 20 MG tablet Take 20 mg by mouth daily. 05/19/21  Yes [provider]  ?ketorolac (ACULAR) 0.5 % ophthalmic solution SMARTSIG:In Eye(s) 06/19/21   [provider]  ?moxifloxacin (VIGAMOX) 0.5 % ophthalmic solution Apply to eye. 06/19/21   [provider]  ?prednisoLONE acetate (PRED FORTE) 1 % ophthalmic suspension SMARTSIG:In Eye(s) 06/19/21   [provider]  ? ? ?Physical Exam: ?Vitals:  ? 06/19/21 1330 06/19/21 1400 06/19/21 1430 06/19/21 1510  ?BP: 133/66 (!) 138/96 (!) 149/66 (!) 152/75  ?Pulse: (!) 58 (!) 56 (!) 59 60  ?Resp: (!) 21 (!) '21 20 18  '$ ?Temp:      ?TempSrc:      ?SpO2: 95% 96% 96% 96%  ?Weight:      ?Height:      ? ? ?Constitutional: Morbidly obese elderly male currently in no acute distress and able to follow command ?Eyes: PERRL, lids and conjunctivae normal ?ENMT: Mucous membranes are moist.   Hard of hearing ?Neck: normal, supple, no masses, no thyromegaly ?Respiratory: clear to auscultation bilaterally, no wheezing, no crackles. Normal respiratory effort.   ?Cardiovascular: Regular rate and rhythm, no murmurs / rubs / gallops. No extremity edema.  No  carotid bruits.  ?Abdomen: Protuberant abdomen bowel sounds in all 4 quadrant. ?Musculoskeletal: no clubbing / cyanosis. No joint deformity upper and lower extremities.   ?Skin: Mild redness noted of the back of the neck. ?Neurologic: CN 2-12 grossly intact. Strength 5/5 in all 4.  ?Psychiatric: Normal judgment and insight. Alert and oriented x 3. Normal mood.  ? ? ?Data Reviewed: ? ?EKG significant for sinus rhythm at74 bpm with first-degree heart block ? ?Assessment  and Plan: ?* TIA (transient ischemic attack) ?Acute.  Patient presents with acute onset of left-sided numbness of the arm and legs.  Last known normal was around 10 AM this morning.  EMS head did not note any acute abnormality. ?-Neurochecks ?-Check MRI Brain without contrast ?-Recommend Vascular imaging with MRA Angio Head without contrast and US Carotid doppler ?-Check echocardiogram   ?-Adding on lipid panel and hemoglobin A1c ?-Aspirin '81mg'$  daily along with plavic '75mg'$  daily x 21 days followed by aspirin '81mg'$  daily alone. ?-Follow-up telemetry overnight ?- PT/OT/SLP consulted to evaluate and treat ?  ? ?Hyperlipidemia ?-Check lipid panel.  Goal LDL less than 70 ?-Continue Crestor for now and adjust dose as needed ? ?Obesity, Class III, BMI 40-49.9 (morbid obesity) ([place]) ?BMI 41.25 kg/m2 ? ?Essential hypertension ?Initial blood pressures elevated up to 152/75.  Home blood pressure regimen includes Bystolic 10 mg daily. ?-Held home blood pressure medications allow for permissive hypertension ?-Hydralazine IV as needed for systolic blood pressures greater than 161 or diastolics greater than 096 ? ? ? ? ? ? ?Advance Care Planning:   Code Status: Full Code   ? ?Consults: Neurology ? ?Family Communication: Daughter updated at bedside ? ?Severity of Illness: ?The appropriate patient status for this patient is OBSERVATION. Observation status is judged to be reasonable and necessary in order to provide the required intensity of service to ensure the patient's safety. The patient's presenting symptoms, physical exam findings, and initial radiographic and laboratory data in the context of their medical condition is felt to place them at decreased risk for further clinical deterioration. Furthermore, it is anticipated that the patient will be medically stable for discharge from the hospital within 2 midnights of admission.  ? ?Author: ?Norval Morton, MD ?06/19/2021 3:23 PM ? ?For on call review [email or web address].  ?

## 2021-06-19 NOTE — Discharge Instructions (Addendum)
Continue aspirin 81 mg p.o. daily and Plavix 75 mg p.o. daily for 3 weeks; then discontinue aspirin and continue Plavix alone. ?

## 2021-06-19 NOTE — Progress Notes (Signed)
?  Echocardiogram ?2D Echocardiogram with contrast has been performed. ? ?Elmer Ramp ?06/19/2021, 5:32 PM ?

## 2021-06-19 NOTE — Assessment & Plan Note (Addendum)
Continue Bystolic 10 mg p.o. daily. ?

## 2021-06-19 NOTE — Assessment & Plan Note (Addendum)
Patient presenting to the ED with acute onset left-sided numbness in his arms and legs with last known normal around 10 AM the morning of ED presentation.  Upon arrival, symptoms relatively resolved.  CT head without contrast with no acute findings.  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  Nephrology was consulted and followed during hospital course.  Patient was started on aspirin and Plavix.  TTE with LVEF 65-70%, LV with no regional wall motion abnormalities, mild bilateral atrial enlargement, no aortic stenosis and intra-atrial septum not well visualized due to echo window. LDL 58, hemoglobin A1c 5.8.  Neurology recommended 3 weeks dual antiplatelet therapy with aspirin/Plavix followed by Plavix alone.  Outpatient follow-up with PCP and neurology as scheduled. ? ?

## 2021-06-19 NOTE — Progress Notes (Signed)
2059: Patient admitted and oriented to unit with daughter at bedside. VSS. No complaints noted. NIHSS-0.  ?

## 2021-06-19 NOTE — ED Triage Notes (Addendum)
Triage RN went to lobby to assess pt on arrival.  Pt reports L sided arm and leg "feels like they are asleep for about an hour."  Code Stroke activated. ? ?Pt states he woke up this morning and everything was normal.  When asked about last known well again he states around 10am.  No arm drift.  Denies difficulty ambulating.  Speech clear.  VAN NEGATIVE. Decreased sensation to L side. ? ?Pt to bridge. ?

## 2021-06-19 NOTE — ED Notes (Signed)
Dr. Steinl at bedside 

## 2021-06-19 NOTE — Code Documentation (Signed)
Stroke Response Nurse Documentation ?Code Documentation ? ?Caleb Johnson is a 81 y.o. male arriving to [facility]  via [employer] on 06/19/2021 with past medical hx of HTN, hyperlipidemia, TIA. On No antithrombotic. Code stroke was activated by ED.  ? ?Patient from home where he was LKW at 1000 and now complaining of Left arm and leg numbness and difficulty moving leg .  ? ?Stroke team at the bedside on patient arrival. Labs drawn and patient cleared for CT by Dr. Ashok Cordia. Patient to CT with team. NIHSS 0, see documentation for details and code stroke times.  The following imaging was completed:  CT Head. Patient is not a candidate for IV Thrombolytic due to symtoms have resolved. Patient is not not a candidate for IR due to no LVO suspected.  ? ?Care Plan: mNIHSS and VS @ 2 hours.  If patient has focal deficit reactive Code Stroke.  ? ?Bedside handoff with ED RN Bland Span.   ? ?Raliegh Ip  ?Stroke Response RN ?  ?

## 2021-06-20 DIAGNOSIS — G459 Transient cerebral ischemic attack, unspecified: Secondary | ICD-10-CM | POA: Diagnosis not present

## 2021-06-20 DIAGNOSIS — E781 Pure hyperglyceridemia: Secondary | ICD-10-CM | POA: Diagnosis not present

## 2021-06-20 DIAGNOSIS — I671 Cerebral aneurysm, nonruptured: Secondary | ICD-10-CM | POA: Diagnosis not present

## 2021-06-20 DIAGNOSIS — I10 Essential (primary) hypertension: Secondary | ICD-10-CM | POA: Diagnosis not present

## 2021-06-20 LAB — LIPID PANEL
Cholesterol: 140 mg/dL (ref 0–200)
HDL: 34 mg/dL — ABNORMAL LOW (ref >40)
LDL Cholesterol: 58 mg/dL (ref 0–99)
Total CHOL/HDL Ratio: 4.1 RATIO
Triglycerides: 242 mg/dL — ABNORMAL HIGH (ref <150)
VLDL: 48 mg/dL — ABNORMAL HIGH (ref 0–40)

## 2021-06-20 MED ORDER — ASPIRIN EC 81 MG PO TBEC: 81.0000 mg | DELAYED_RELEASE_TABLET | Freq: Every day | ORAL | 0 refills | Status: AC

## 2021-06-20 MED ORDER — CLOPIDOGREL BISULFATE 75 MG PO TABS
75.0000 mg | ORAL_TABLET | Freq: Every day | ORAL | 0 refills | Status: AC
Start: 2021-06-21 — End: 2021-09-19

## 2021-06-20 NOTE — Progress Notes (Addendum)
STROKE TEAM PROGRESS NOTE   INTERVAL HISTORY His daughter is at the bedside.  Patient was seen with attending, sitting comfortably on the side of bed. Patient denied acute weakness, tingling, numbness or other changes. Reported feeling well and had no other concerns.   Vitals:   06/19/21 1905 06/19/21 2059 06/20/21 0058 06/20/21 0339  BP: 131/67 123/87 114/71 110/68  Pulse: 75 66 61 65  Resp: '19 18 16 20  '$ Temp:  98.2 F (36.8 C) 97.9 F (36.6 C) 98 F (36.7 C)  TempSrc:  Oral Oral Oral  SpO2: 96% 100% 95% 95%  Weight:      Height:       CBC:  Recent Labs  Lab 06/19/21 1155 06/19/21 1159  WBC 8.5  --   NEUTROABS 5.6  --   HGB 13.8 15.0  HCT 43.8 44.0  MCV 94.0  --   PLT 224  --    Basic Metabolic Panel:  Recent Labs  Lab 06/19/21 1155 06/19/21 1159  NA 137 139  K 3.9 4.0  CL 101 101  CO2 27  --   GLUCOSE 182* 175*  BUN 16 17  CREATININE 0.93 0.90  CALCIUM 8.8*  --    Lipid Panel:  Recent Labs  Lab 06/19/21 1213  CHOL 140  TRIG 242*  HDL 34*  CHOLHDL 4.1  VLDL 48*  LDLCALC 58    HgbA1c:  Recent Labs  Lab 06/19/21 1534  HGBA1C 5.8*    Urine Drug Screen: No results for input(s): LABOPIA, COCAINSCRNUR, LABBENZ, AMPHETMU, THCU, LABBARB in the last 168 hours.  Alcohol Level No results for input(s): ETH in the last 168 hours.  IMAGING past 24 hours MR ANGIO HEAD WO CONTRAST  Result Date: 06/19/2021 CLINICAL DATA:  Stroke, follow-up. EXAM: MRA HEAD WITHOUT CONTRAST TECHNIQUE: Angiographic images of the Circle of Willis were acquired using MRA technique without intravenous contrast. COMPARISON:  Noncontrast brain MRI performed earlier today 06/19/2021. Noncontrast head CT performed earlier today 06/19/2021. FINDINGS: Anterior circulation: The intracranial internal carotid arteries are patent. The M1 middle cerebral arteries are patent. No M2 proximal branch occlusion or high-grade proximal stenosis is identified. The anterior cerebral arteries are patent. 3 x  2 mm broad-based inferomedially projecting vascular protrusion arising from the cavernous left ICA, suspicious for an aneurysm (for instance as seen on series 2, images 58-63) Posterior circulation: The intracranial vertebral arteries are patent. The left vertebral artery is dominant. The basilar artery is patent. The posterior cerebral arteries are patent. A right posterior communicating artery is present. The left posterior communicating artery is diminutive or absent. Anatomic variants: As described. IMPRESSION: No intracranial large vessel occlusion or proximal high-grade arterial stenosis. 3 x 2 mm broad-based inferomedially projecting vascular protrusion arising from the cavernous left internal carotid artery, suspicious for an aneurysm. Electronically Signed   By: Kellie Simmering D.O.   On: 06/19/2021 18:42   MR BRAIN WO CONTRAST  Result Date: 06/19/2021 CLINICAL DATA:  Provided history: Neuro deficit, acute, stroke suspected. EXAM: MRI HEAD WITHOUT CONTRAST TECHNIQUE: Multiplanar, multiecho pulse sequences of the brain and surrounding structures were obtained without intravenous contrast. COMPARISON:  Non-contrast head CT performed earlier today 06/19/2021. FINDINGS: Brain: Intermittently motion degraded examination. Most notably, there is moderate motion degradation of the coronal T2 TSE sequence. Mild-to-moderate generalized cerebral atrophy. Redemonstrated small chronic cortical/subcortical infarct within the right parietal lobe. Suspected additional tiny chronic cortical/subcortical infarct within the mid to anterior right frontal lobe (series 6, image 22). A few small T2  FLAIR hyperintense foci elsewhere within the cerebral white matter are nonspecific, but compatible with minimal changes of chronic small vessel ischemia. Small chronic infarcts within the bilateral cerebellar hemispheres (series 5, images 7 and 8). There is no acute infarct. No evidence of an intracranial mass. No chronic intracranial  blood products. No extra-axial fluid collection. No midline shift. Vascular: Maintained flow voids within the proximal large arterial vessels. Skull and upper cervical spine: No focal suspicious marrow lesion. Sinuses/Orbits: Visualized orbits show no acute finding. Right ocular lens replacement. Minimal mucosal thickening within the bilateral ethmoid sinuses. IMPRESSION: Intermittently motion degraded exam. No evidence of acute intracranial abnormality. 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 Suspected additional tiny chronic cortical/subcortical infarct within the mid-to-anterior right frontal lobe (right MCA vascular territory). Background minimal chronic small-vessel ischemic changes within the cerebral white matter. 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 Mild-to-moderate generalized cerebral atrophy. Electronically Signed   By: Kellie Simmering D.O.   On: 06/19/2021 18:06   ECHOCARDIOGRAM COMPLETE  Result Date: 06/19/2021    ECHOCARDIOGRAM REPORT   Patient Name:   Caleb Johnson Date of Exam: 06/19/2021 Medical Rec #:  [id number]      Height:       67.0 in Accession #:    [id number]     Weight:       263.2 lb Date of Birth:  Nov 30, 1940     BSA:          2.273 m Patient Age:    11 years       BP:           128/68 mmHg Patient Gender: M              HR:           64 bpm. Exam Location:  Inpatient Procedure: 2D Echo, Cardiac Doppler, Color Doppler and Intracardiac            Opacification Agent Indications:    TIA  History:        Patient has no prior history of Echocardiogram examinations.  Sonographer:    Merrie Roof RDCS Referring Phys: [id number] [place]  1. Left ventricular ejection fraction, by estimation, is 65 to 70%. The left ventricle has normal function. The left ventricle has no regional wall motion abnormalities. There is mild left ventricular hypertrophy. Left ventricular  diastolic parameters are indeterminate.  2. Right ventricular systolic function is normal. The right ventricular size is normal. Tricuspid regurgitation signal is inadequate for assessing PA pressure.  3. Left atrial size was mildly dilated.  4. Right atrial size was mildly dilated.  5. The mitral valve is normal in structure. No evidence of mitral valve regurgitation. No evidence of mitral stenosis.  6. The aortic valve was not well visualized. Aortic valve regurgitation is not visualized. No aortic stenosis is present. FINDINGS  Left Ventricle: Left ventricular ejection fraction, by estimation, is 65 to 70%. The left ventricle has normal function. The left ventricle has no regional wall motion abnormalities. Definity contrast agent was given IV to delineate the left ventricular  endocardial borders. The left ventricular internal cavity size was normal in size. There is mild left ventricular hypertrophy. Left ventricular diastolic parameters are indeterminate. Right Ventricle: The right ventricular size is normal. Right vetricular wall thickness was not well visualized. Right ventricular systolic function is normal. Tricuspid regurgitation signal is inadequate for assessing PA pressure. Left Atrium: Left atrial size was  mildly dilated. Right Atrium: Right atrial size was mildly dilated. Pericardium: There is no evidence of pericardial effusion. Presence of epicardial fat layer. Mitral Valve: The mitral valve is normal in structure. No evidence of mitral valve regurgitation. No evidence of mitral valve stenosis. Tricuspid Valve: The tricuspid valve is normal in structure. Tricuspid valve regurgitation is trivial. Aortic Valve: The aortic valve was not well visualized. Aortic valve regurgitation is not visualized. No aortic stenosis is present. Aortic valve mean gradient measures 4.0 mmHg. Aortic valve peak gradient measures 7.4 mmHg. Aortic valve area, by VTI measures 2.69 cm. Pulmonic Valve: The pulmonic valve was  not well visualized. Pulmonic valve regurgitation is not visualized. Aorta: The aortic root is normal in size and structure. IAS/Shunts: The interatrial septum was not well visualized.  LEFT VENTRICLE PLAX 2D LVIDd:         5.20 cm   Diastology LVIDs:         3.70 cm   LV e' medial:    7.28 cm/s LV PW:         1.00 cm   LV E/e' medial:  8.1 LV IVS:        1.30 cm   LV e' lateral:   7.58 cm/s LVOT diam:     2.10 cm   LV E/e' lateral: 7.8 LV SV:         81 LV SV Index:   36 LVOT Area:     3.46 cm  RIGHT VENTRICLE RV Basal diam:  3.70 cm LEFT ATRIUM             Index        RIGHT ATRIUM           Index LA diam:        4.00 cm 1.76 cm/m   RA Area:     27.20 cm LA Vol (A2C):   72.0 ml 31.68 ml/m  RA Volume:   90.60 ml  39.86 ml/m LA Vol (A4C):   93.0 ml 40.92 ml/m LA Biplane Vol: 81.7 ml 35.95 ml/m  AORTIC VALVE AV Area (Vmax):    2.73 cm AV Area (Vmean):   2.68 cm AV Area (VTI):     2.69 cm AV Vmax:           136.00 cm/s AV Vmean:          90.600 cm/s AV VTI:            0.303 m AV Peak Grad:      7.4 mmHg AV Mean Grad:      4.0 mmHg LVOT Vmax:         107.00 cm/s LVOT Vmean:        70.200 cm/s LVOT VTI:          0.235 m LVOT/AV VTI ratio: 0.78  AORTA Ao Root diam: 3.90 cm MITRAL VALVE MV Area (PHT): 3.30 cm    SHUNTS MV Decel Time: 230 msec    Systemic VTI:  0.24 m MV E velocity: 59.20 cm/s  Systemic Diam: 2.10 cm MV A velocity: 85.70 cm/s MV E/A ratio:  0.69 Oswaldo Milian MD Electronically signed by Oswaldo Milian MD Signature Date/Time: 06/19/2021/6:17:11 PM    Final    VAS US CAROTID  Result Date: 06/20/2021 Carotid Arterial Duplex Study Patient Name:  Caleb Johnson  Date of Exam:   06/19/2021 Medical Rec #: [id number]       Accession #:    [id number] Date of Birth: 19-Feb-1941  Patient Gender: M Patient Age:   69 years Exam Location:  [facility] Procedure:      VAS US CAROTID Referring Phys: Fuller Plan  --------------------------------------------------------------------------------  Indications:       TIA. Risk Factors:      Hypertension, hyperlipidemia. Comparison Study:  No prior studies. Performing Technologist: Oliver Hum RVT  Examination Guidelines: A complete evaluation includes B-mode imaging, spectral Doppler, color Doppler, and power Doppler as needed of all accessible portions of each vessel. Bilateral testing is considered an integral part of a complete examination. Limited examinations for reoccurring indications may be performed as noted.  Right Carotid Findings: +----------+--------+--------+--------+-----------------------+--------+             PSV cm/s EDV cm/s Stenosis Plaque Description      Comments  +----------+--------+--------+--------+-----------------------+--------+  CCA Prox   85       11                                                  +----------+--------+--------+--------+-----------------------+--------+  CCA Distal 95       14                smooth and heterogenous           +----------+--------+--------+--------+-----------------------+--------+  ICA Prox   71       11                smooth and heterogenous           +----------+--------+--------+--------+-----------------------+--------+  ICA Distal 60       14                                        tortuous  +----------+--------+--------+--------+-----------------------+--------+  ECA        116      12                                                  +----------+--------+--------+--------+-----------------------+--------+ +----------+--------+-------+--------+-------------------+             PSV cm/s EDV cms Describe Arm Pressure (mmHG)  +----------+--------+-------+--------+-------------------+  Subclavian 126                                            +----------+--------+-------+--------+-------------------+ +---------+--------+--+--------+-+---------+  Vertebral PSV cm/s 34 EDV cm/s 7 Antegrade   +---------+--------+--+--------+-+---------+  Left Carotid Findings: +----------+--------+--------+--------+-----------------------+--------+             PSV cm/s EDV cm/s Stenosis Plaque Description      Comments  +----------+--------+--------+--------+-----------------------+--------+  CCA Prox   95       10                smooth and heterogenous           +----------+--------+--------+--------+-----------------------+--------+  CCA Distal 80       9                 smooth and heterogenous           +----------+--------+--------+--------+-----------------------+--------+  ICA Prox   92       20                smooth and heterogenous           +----------+--------+--------+--------+-----------------------+--------+  ICA Distal 49       9                                         tortuous  +----------+--------+--------+--------+-----------------------+--------+  ECA        125      8                                                   +----------+--------+--------+--------+-----------------------+--------+ +----------+--------+--------+--------+-------------------+             PSV cm/s EDV cm/s Describe Arm Pressure (mmHG)  +----------+--------+--------+--------+-------------------+  Subclavian 124                                             +----------+--------+--------+--------+-------------------+ +---------+--------+--+--------+-+---------+  Vertebral PSV cm/s 34 EDV cm/s 8 Antegrade  +---------+--------+--+--------+-+---------+   Summary: Right Carotid: Velocities in the right ICA are consistent with a 1-39% stenosis. Left Carotid: Velocities in the left ICA are consistent with a 1-39% stenosis. Vertebrals: Bilateral vertebral arteries demonstrate antegrade flow. *See table(s) above for measurements and observations.  Electronically signed by Orlie Pollen on 06/20/2021 at 10:14:57 AM.    Final     PHYSICAL EXAM  Physical Exam  Constitutional: Appears well-developed and well-nourished.  Psych: Affect  appropriate to situation Respiratory: Effort normal, non-labored breathing  Neuro: Mental Status: Patient is awake, alert, oriented to person, place, month, year, and situation. Patient is able to give a clear and coherent history. No signs of aphasia or neglect Cranial Nerves: II: Visual Fields are full. Pupils are equal, round, and reactive to light.   III,IV, VI: EOMI without ptosis or diploplia V: Facial sensation is symmetric to temperature VII: Facial movement is symmetric resting and smiling VIII: Hearing is intact to voice, mildly hard of hearing X: Palate elevates symmetrically XI: Shoulder shrug is symmetric XII: Tongue protrudes midline without atrophy or fasciculations Motor: Tone is normal. Bulk is normal. 5/5 strength was present in all four extremities.  Sensory: Sensation is symmetric to light touch and temperature in the arms and legs. No extinction to DSS present.  Cerebellar: FNF and HKS are intact bilaterally  ASSESSMENT/PLAN Mr. Caleb Johnson is a 81 y.o. male with history of HTN, HLD, CAD with prior MI in 1995, diabetes mellitus type 2, prior tobacco abuse, and morbid obesity who presented to [facility] ED on 3/4 with reports of numbness and tingling of his left arm and leg that has resolved.   TIA Code Stroke CT head: No acute abnormality. 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 Mild-to-moderate generalized cerebral atrophy. MRI: No evidence of acute infarct, findings consistent with CT head MRA: No intracranial large vessel occlusion or proximal high-grade arterial stenosis. 3 x 2 mm broad-based inferomedially projecting vascular protrusion arising from the cavernous left internal carotid artery, suspicious for an  aneurysm. Carotid Doppler: Unremarkable 2D Echo: EF 65-70%. LV mild hypertrophy, no regional wall motion abnormalities. Atrium b/l was mildly  dilated. No evidence of MV or AB stenosis. LDL 58 HgbA1c 5.8 - h/o T2DM VTE prophylaxis -  Lovenox No antithrombotic prior to admission, now on aspirin 81 mg daily and clopidogrel 75 mg daily DAPT for 3 weeks and then aspirin alone.  Therapy recommendations:  No PT/OT  Disposition:  Home  Hypertension Home meds: nebivolol 10 mg daily  Stable Long-term BP goal normotensive  Hyperlipidemia Home meds:  Crestor 20 mg daily, resumed in hospital LDL 58, goal < 70 Continue statin at discharge  Other Stroke Risk Factors Advanced Age >/= 41  Former cigarette smoker, advised to continue abstinence  Obesity, Body mass index is 41.23 kg/m., BMI >/= 30 associated with increased stroke risk, recommend weight loss, diet and exercise as appropriate  Coronary artery disease    Hospital day # 0  Signed: Merrily Brittle, DO Resident, PGY-1 [place] 06/20/2021, 2:32 PM   ATTENDING NOTE: I reviewed above note and agree with the assessment and plan. Pt was seen and examined.   81 year old male with history of hypertension, hyperlipidemia admitted for transient episode of left-sided numbness and weakness.  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  MRI no acute infarct.  MRA left ICA siphon 3x2 possible aneurysm.  Carotid Doppler negative EF 65 to 70%.  LDL 58, A1c 5.8.  Creatinine 0.90.  On exam, daughter at bedside.  Patient stated that his symptoms all resolved.  Neurologically intact on exam, no focal deficit.  Etiology for patient symptoms concerning for TIA.  Recommend DAPT for 3 weeks and then aspirin alone.  Continue Crestor.  PT/OT no recommendation.  MRA showed small aneurysm, recommend follow-up MRA in 1 year.  Patient will follow-up with [facility] in 4 to 5 weeks.  For detailed assessment and plan, please refer to above as I have made changes wherever appropriate.   Neurology will sign off. Please call with questions. Pt will follow up with  stroke clinic NP at [facility] in about 4 weeks. Thanks for the consult.   Rosalin Hawking, MD PhD Stroke Neurology 06/20/2021 3:58 PM    To contact Stroke Continuity provider, please refer to [email or web address] After hours, contact General Neurology

## 2021-06-20 NOTE — Evaluation (Signed)
Physical Therapy Evaluation and Discharge ?Patient Details ?Name: Caleb Johnson ?MRN: [id number] ?DOB: Mar 28, 1941 ?Today's Date: 06/20/2021 ? ?History of Present Illness ? 81 y.o. male who presents 06/19/21 with reports of numbness and tingling of his left arm and leg. CT scan of the brain did not note any acute abnormality, but did note a small chronic appearing of the right parietal lobe and the left cerebellar hemisphere. MRI negative for acute changes; ? small aneurysm Lt ICA.   PMH significant of hypertension, hyperlipidemia, CAD with prior history of MI in 1995, diabetes mellitus type 2,prior tobacco abuse, and morbid obesity  ?Clinical Impression ?  ?Patient evaluated by Physical Therapy with no further acute PT needs identified. Patient scored 22/24 on Dynamic Gait Index and had no loss of balance with balance assessment.  PT is signing off. Thank you for this referral. ?   ?   ? ?Recommendations for follow up therapy are one component of a multi-disciplinary discharge planning process, led by the attending physician.  Recommendations may be updated based on patient status, additional functional criteria and insurance authorization. ? ?Follow Up Recommendations No PT follow up ? ?  ?Assistance Recommended at Discharge None  ?Patient can return home with the following ?   ? ?  ?Equipment Recommendations None recommended by PT  ?Recommendations for Other Services ?    ?  ?Functional Status Assessment Patient has not had a recent decline in their functional status  ? ?  ?Precautions / Restrictions Precautions ?Precautions: None ?Restrictions ?Weight Bearing Restrictions: No  ? ?  ? ?Mobility ? Bed Mobility ?Overal bed mobility: Modified Independent ?  ?  ?  ?  ?  ?  ?General bed mobility comments: HOB elevated slightly, no assist ?  ? ?Transfers ?Overall transfer level: Modified independent ?Equipment used: None ?  ?  ?  ?  ?  ?  ?  ?General transfer comment: No difficulties, pt moving well ?   ? ?Ambulation/Gait ?Ambulation/Gait assistance: Independent ?Gait Distance (Feet): 300 Feet ?Assistive device: None ?Gait Pattern/deviations: WFL(Within Functional Limits) ?  ?Gait velocity interpretation: >2.62 ft/sec, indicative of community ambulatory ?  ?General Gait Details: see DGI ? ?Stairs ?  ?  ?  ?  ?  ? ?Wheelchair Mobility ?  ? ?Modified Rankin (Stroke Patients Only) ?Modified Rankin (Stroke Patients Only) ?Pre-Morbid Rankin Score: No symptoms ?Modified Rankin: No symptoms ? ?  ? ?Balance Overall balance assessment: Independent ?  ?  ?  ?  ?  ?  ?  ?  ?  ?  ?  ?Rhomberg - Eyes Opened: 30 ?Rhomberg - Eyes Closed: 10 ?  ?High Level Balance Comments: modified tandem stance x 15 seconds with no imbalance ?Standardized Balance Assessment ?Standardized Balance Assessment : Dynamic Gait Index ?  ?Dynamic Gait Index ?Level Surface: Normal ?Change in Gait Speed: Normal ?Gait with Horizontal Head Turns: Mild Impairment ?Gait with Vertical Head Turns: Normal ?Gait and Pivot Turn: Normal ?Step Over Obstacle: Mild Impairment ?Step Around Obstacles: Normal ?Steps: Normal ?Total Score: 22 ?   ? ? ? ?Pertinent Vitals/Pain    ? ? ?Home Living Family/patient expects to be discharged to:: Private residence ?Living Arrangements: Other relatives Yolanda Bonine) ?Available Help at Discharge: Family ?Type of Home: House ?Home Access: Level entry ?  ?  ?  ?Home Layout: One level ?Home Equipment: None ?   ?  ?Prior Function Prior Level of Function : Independent/Modified Independent;Driving ?  ?  ?  ?  ?  ?  ?  ?  ?  ? ? ?  Hand Dominance  ? Dominant Hand: Right ? ?  ?Extremity/Trunk Assessment  ? Upper Extremity Assessment ?Upper Extremity Assessment: Defer to OT evaluation ?  ? ?Lower Extremity Assessment ?Lower Extremity Assessment: Overall WFL for tasks assessed ?  ? ?Cervical / Trunk Assessment ?Cervical / Trunk Assessment: Normal  ?Communication  ? Communication: No difficulties  ?Cognition Arousal/Alertness:  Awake/alert ?Behavior During Therapy: [facility] for tasks assessed/performed ?Overall Cognitive Status: Within Functional Limits for tasks assessed ?  ?  ?  ?  ?  ?  ?  ?  ?  ?  ?  ?  ?  ?  ?  ?  ?  ?  ?  ? ?  ?General Comments General comments (skin integrity, edema, etc.): VSS on RA ? ?  ?Exercises    ? ?Assessment/Plan  ?  ?PT Assessment Patient does not need any further PT services  ?PT Problem List   ? ?   ?  ?PT Treatment Interventions     ? ?PT Goals (Current goals can be found in the Care Plan section)  ?Acute Rehab PT Goals ?Patient Stated Goal: go home today ?PT Goal Formulation: All assessment and education complete, DC therapy ? ?  ?Frequency   ?  ? ? ?Co-evaluation   ?  ?  ?  ?  ? ? ?  ?AM-PAC PT "6 Clicks" Mobility  ?Outcome Measure Help needed turning from your back to your side while in a flat bed without using bedrails?: None ?Help needed moving from lying on your back to sitting on the side of a flat bed without using bedrails?: None ?Help needed moving to and from a bed to a chair (including a wheelchair)?: None ?Help needed standing up from a chair using your arms (e.g., wheelchair or bedside chair)?: None ?Help needed to walk in hospital room?: None ?Help needed climbing 3-5 steps with a railing? : None ?6 Click Score: 24 ? ?  ?End of Session Equipment Utilized During Treatment: Gait belt ?Activity Tolerance: Patient tolerated treatment well ?Patient left: in bed;with call bell/phone within reach ?Nurse Communication: Mobility status;Other (comment) (no PT, DME needs) ?PT Visit Diagnosis: Difficulty in walking, not elsewhere classified (R26.2) ?  ? ?Time: 9983-3825 ?PT Time Calculation (min) (ACUTE ONLY): 9 min ? ? ?Charges:   PT Evaluation ?$PT Eval Low Complexity: 1 Low ?  ?  ?   ? ? ? ?Arby Barrette, PT ?Acute Rehabilitation Services  ?Pager [phone] ?Office [phone] ? ? ?Jeanie Cooks Jari Carollo ?06/20/2021, 11:27 AM ? ?

## 2021-06-20 NOTE — Progress Notes (Signed)
SLP Cancellation Note ? ?Patient Details ?Name: Caleb Johnson ?MRN: [id number] ?DOB: 1940/06/04 ? ? ?Cancelled treatment:       Reason Eval/Treat Not Completed: SLP screened, no needs identified, will sign off ? ?Pt reports resolution of all symptoms and defers speech language evaluation. Imminent discharge.  ? ?Jenia Klepper P. Avalie Oconnor, M.S., CCC-SLP ?Speech-Language Pathologist ?Acute Rehabilitation Services ?Pager: [phone] ? ?[place] ?06/20/2021, 11:00 AM ?

## 2021-06-20 NOTE — Assessment & Plan Note (Signed)
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  Discussed with neurology, Dr. Erlinda Hong; given small size no intervention required at this time.  Recommend outpatient follow-up and likely repeat MRA head in 1 year. ?

## 2021-06-20 NOTE — Hospital Course (Signed)
Caleb Johnson is a 81 y.o. male with medical history significant for HTN, HLD, CAD with prior MI in 1995, diabetes mellitus type 2, prior tobacco abuse, and morbid obesity who presented to [facility] ED on 3/4 with reports of numbness and tingling of his left arm and leg this morning around 10 AM.  Symptoms lasted approximately hour and a half prior to starting to resolve. Denied any HA or change in vision or speech.  After patient was evaluated in triage and Code stroke activated. CT scan of the brain did not note any acute abnormality, but did note a small chronic appearing of the right parietal lobe and the left cerebellar hemisphere.  Patient was afebrile heart rate 56-68, respirations 16-22, blood pressure 131/68 - 152/75, and O2 saturations maintained. Labs were relatively unremarkable except for glucose of 182.  He was not a [facility] candidate due to symptoms resolving.  Symptoms did not give concern for large vessel occlusion. Patient had been started on aspirin and Plavix. Neurology was consulted and [facility] consulted for further evaluation and management with concern for TIA versus stroke. ?

## 2021-06-20 NOTE — Progress Notes (Signed)
Patient achieved stated goals and is satisfied with care. Medications, scheduled and follow-up appointments reviewed and verbalized understanding.  ?

## 2021-06-20 NOTE — Discharge Summary (Signed)
Physician Discharge Summary  Caleb Johnson TOI:[id number] DOB: Feb 27, 1941 DOA: 06/19/2021  PCP: Seward Carol, MD  Admit date: 06/19/2021 Discharge date: 06/20/2021  Admitted From: Home Disposition: Home  Recommendations for Outpatient Follow-up:  Follow up with PCP as scheduled next Tuesday Follow-up with neurology as scheduled Continue aspirin/Plavix for 3 weeks followed by Plavix alone per neurology recommendations Consider starting fenofibrate given elevated triglycerides on lipid panel Continue encouragement regarding need for weight loss  Home Health: No Equipment/Devices: None  Discharge Condition: Stable CODE STATUS: Full code Diet recommendation: Heart healthy diet  History of present illness:  Caleb Johnson is a 81 y.o. male with medical history significant for HTN, HLD, CAD with prior MI in 1995, diabetes mellitus type 2, prior tobacco abuse, and morbid obesity who presented to [facility] ED on 3/4 with reports of numbness and tingling of his left arm and leg this morning around 10 AM.  Symptoms lasted approximately hour and a half prior to starting to resolve. Denied any HA or change in vision or speech.  After patient was evaluated in triage and Code stroke activated. CT scan of the brain did not note any acute abnormality, but did note a small chronic appearing of the right parietal lobe and the left cerebellar hemisphere.  Patient was afebrile heart rate 56-68, respirations 16-22, blood pressure 131/68 - 152/75, and O2 saturations maintained. Labs were relatively unremarkable except for glucose of 182.  He was not a TNKAse candidate due to symptoms resolving.  Symptoms did not give concern for large vessel occlusion. Patient had been started on aspirin and Plavix. Neurology was consulted and [facility] consulted for further evaluation and management with concern for TIA versus stroke.  Hospital course:  Assessment and Plan: * TIA (transient ischemic attack) Patient presenting to the  ED with acute onset left-sided numbness in his arms and legs with last known normal around 10 AM the morning of ED presentation.  Upon arrival, symptoms relatively resolved.  CT head without contrast with no acute findings.  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  Nephrology was consulted and followed during hospital course.  Patient was started on aspirin and Plavix.  TTE with LVEF 65-70%, LV with no regional wall motion abnormalities, mild bilateral atrial enlargement, no aortic stenosis and intra-atrial septum not well visualized due to echo window. LDL 58, hemoglobin A1c 5.8.  Neurology recommended 3 weeks dual antiplatelet therapy with aspirin/Plavix followed by Plavix alone.  Outpatient follow-up with PCP and neurology as scheduled.   Aneurysm of left internal carotid artery MRA head with small vascular protrusion arising from the cavernous left internal carotid artery concerning for aneurysm.  Discussed with neurology, Dr. Erlinda Hong; given small size no intervention required at this time.  Recommend outpatient follow-up and likely repeat MRA head in 1 year.  Essential hypertension Continue Bystolic 10 mg p.o. daily.  Hyperlipidemia Lipid panel with total cholesterol 160, HDL 34, LDL 58, triglycerides 242. Continue Crestor 20 mg p.o. daily.  Outpatient follow-up with PCP.  May need to start fenofibrate.  Obesity, Class III, BMI 40-49.9 (morbid obesity) (HCC) BMI 41.25 kg/m2.  Discussed with patient needs for aggressive lifestyle changes/weight loss as this complicates all facets of care.  Outpatient follow-up with PCP.  Discharge Diagnoses:  Principal Problem:   TIA (transient ischemic attack) Active Problems:   Aneurysm of left  internal carotid artery   Essential hypertension   Hyperlipidemia   Obesity, Class III, BMI 40-49.9 (morbid obesity) ([place])    Discharge Instructions  Discharge Instructions     Call MD for:  difficulty breathing, headache or visual disturbances   Complete by: As directed    Call MD for:  extreme fatigue   Complete by: As directed    Call MD for:  persistant dizziness or light-headedness   Complete by: As directed    Call MD for:  persistant nausea and vomiting   Complete by: As directed    Call MD for:  severe uncontrolled pain   Complete by: As directed    Call MD for:  temperature >100.4   Complete by: As directed    Diet - low sodium heart healthy   Complete by: As directed    Increase activity slowly   Complete by: As directed    No wound care   Complete by: As directed       Allergies as of 06/20/2021   No Known Allergies      Medication List     TAKE these medications    aspirin EC 81 MG tablet Take 1 tablet (81 mg total) by mouth daily for 21 days. Swallow whole.   clopidogrel 75 MG tablet Commonly known as: PLAVIX Take 1 tablet (75 mg total) by mouth daily. Start taking on: June 21, 2021   ketorolac 0.5 % ophthalmic solution Commonly known as: ACULAR SMARTSIG:In Eye(s)   metFORMIN 500 MG tablet Commonly known as: GLUCOPHAGE Take 500 mg by mouth daily.   moxifloxacin 0.5 % ophthalmic solution Commonly known as: VIGAMOX Apply to eye.   nebivolol 10 MG tablet Commonly known as: BYSTOLIC Take 10 mg by mouth daily.   prednisoLONE acetate 1 % ophthalmic suspension Commonly known as: PRED FORTE SMARTSIG:In Eye(s)   Restasis 0.05 % ophthalmic emulsion Generic drug: cycloSPORINE Place 1 drop into both eyes 2 (two) times daily.   rosuvastatin 20 MG tablet Commonly known as: CRESTOR Take 20 mg by mouth daily.        Follow-up Information     [facility] In 1 week.   Contact information: [address]     [place] [address] [phone]        Seward Carol, MD. Schedule an appointment as soon as possible for a visit in 1 week(s).   Specialty: Internal Medicine Contact information: [address] [place] [address] [phone]                No Known Allergies  Consultations: Neurology   Procedures/Studies: MR ANGIO HEAD WO CONTRAST  Result Date: 06/19/2021 CLINICAL DATA:  Stroke, follow-up. EXAM: MRA HEAD WITHOUT CONTRAST TECHNIQUE: Angiographic images of the Circle of Willis were acquired using MRA technique without intravenous contrast. COMPARISON:  Noncontrast brain MRI performed earlier today 06/19/2021. Noncontrast head CT performed earlier today 06/19/2021. FINDINGS: Anterior circulation: The intracranial internal carotid arteries are patent. The M1 middle cerebral arteries are patent. No M2 proximal branch occlusion or high-grade proximal stenosis is identified. The anterior cerebral arteries are patent. 3 x 2 mm broad-based inferomedially projecting vascular protrusion arising from the cavernous left ICA, suspicious for an aneurysm (for instance as seen on series 2, images 58-63) Posterior circulation: The intracranial vertebral arteries are patent. The left vertebral artery is  dominant. The basilar artery is patent. The posterior cerebral arteries are patent. A right posterior communicating artery is present. The left posterior communicating artery is diminutive or absent. Anatomic variants: As described. IMPRESSION: No intracranial large vessel occlusion or proximal high-grade arterial stenosis. 3 x 2 mm broad-based inferomedially projecting vascular protrusion arising from the cavernous left internal carotid artery, suspicious for an aneurysm. Electronically Signed   By: Kellie Simmering D.O.   On: 06/19/2021 18:42   MR BRAIN WO CONTRAST  Result Date: 06/19/2021 CLINICAL DATA:  Provided history: Neuro deficit, acute, stroke suspected.  EXAM: MRI HEAD WITHOUT CONTRAST TECHNIQUE: Multiplanar, multiecho pulse sequences of the brain and surrounding structures were obtained without intravenous contrast. COMPARISON:  Non-contrast head CT performed earlier today 06/19/2021. FINDINGS: Brain: Intermittently motion degraded examination. Most notably, there is moderate motion degradation of the coronal T2 TSE sequence. Mild-to-moderate generalized cerebral atrophy. Redemonstrated small chronic cortical/subcortical infarct within the right parietal lobe. Suspected additional tiny chronic cortical/subcortical infarct within the mid to anterior right frontal lobe (series 6, image 22). A few small T2 FLAIR hyperintense foci elsewhere within the cerebral white matter are nonspecific, but compatible with minimal changes of chronic small vessel ischemia. Small chronic infarcts within the bilateral cerebellar hemispheres (series 5, images 7 and 8). There is no acute infarct. No evidence of an intracranial mass. No chronic intracranial blood products. No extra-axial fluid collection. No midline shift. Vascular: Maintained flow voids within the proximal large arterial vessels. Skull and upper cervical spine: No focal suspicious marrow lesion. Sinuses/Orbits: Visualized orbits show no acute finding. Right ocular lens replacement. Minimal mucosal thickening within the bilateral ethmoid sinuses. IMPRESSION: Intermittently motion degraded exam. No evidence of acute intracranial abnormality. 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 Suspected additional tiny chronic cortical/subcortical infarct within the mid-to-anterior right frontal lobe (right MCA vascular territory). Background minimal chronic small-vessel ischemic changes within the cerebral white matter. 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 Mild-to-moderate generalized cerebral atrophy. Electronically Signed    By: Kellie Simmering D.O.   On: 06/19/2021 18:06   ECHOCARDIOGRAM COMPLETE  Result Date: 06/19/2021    ECHOCARDIOGRAM REPORT   Patient Name:   Caleb Johnson Date of Exam: 06/19/2021 Medical Rec #:  [id number]      Height:       67.0 in Accession #:    [id number]     Weight:       263.2 lb Date of Birth:  1941/03/27     BSA:          2.273 m Patient Age:    60 years       BP:           128/68 mmHg Patient Gender: M              HR:           64 bpm. Exam Location:  Inpatient Procedure: 2D Echo, Cardiac Doppler, Color Doppler and Intracardiac            Opacification Agent Indications:    TIA  History:        Patient has no prior history of Echocardiogram examinations.  Sonographer:    Merrie Roof RDCS Referring Phys: [id number] [place]  1. Left ventricular ejection fraction, by estimation, is 65 to 70%. The left ventricle has normal function. The left ventricle has no regional wall motion abnormalities. There is mild left ventricular hypertrophy. Left ventricular diastolic parameters  are indeterminate.  2. Right ventricular systolic function is normal. The right ventricular size is normal. Tricuspid regurgitation signal is inadequate for assessing PA pressure.  3. Left atrial size was mildly dilated.  4. Right atrial size was mildly dilated.  5. The mitral valve is normal in structure. No evidence of mitral valve regurgitation. No evidence of mitral stenosis.  6. The aortic valve was not well visualized. Aortic valve regurgitation is not visualized. No aortic stenosis is present. FINDINGS  Left Ventricle: Left ventricular ejection fraction, by estimation, is 65 to 70%. The left ventricle has normal function. The left ventricle has no regional wall motion abnormalities. Definity contrast agent was given IV to delineate the left ventricular  endocardial borders. The left ventricular internal cavity size was normal in size. There is mild left ventricular hypertrophy. Left ventricular diastolic parameters  are indeterminate. Right Ventricle: The right ventricular size is normal. Right vetricular wall thickness was not well visualized. Right ventricular systolic function is normal. Tricuspid regurgitation signal is inadequate for assessing PA pressure. Left Atrium: Left atrial size was mildly dilated. Right Atrium: Right atrial size was mildly dilated. Pericardium: There is no evidence of pericardial effusion. Presence of epicardial fat layer. Mitral Valve: The mitral valve is normal in structure. No evidence of mitral valve regurgitation. No evidence of mitral valve stenosis. Tricuspid Valve: The tricuspid valve is normal in structure. Tricuspid valve regurgitation is trivial. Aortic Valve: The aortic valve was not well visualized. Aortic valve regurgitation is not visualized. No aortic stenosis is present. Aortic valve mean gradient measures 4.0 mmHg. Aortic valve peak gradient measures 7.4 mmHg. Aortic valve area, by VTI measures 2.69 cm. Pulmonic Valve: The pulmonic valve was not well visualized. Pulmonic valve regurgitation is not visualized. Aorta: The aortic root is normal in size and structure. IAS/Shunts: The interatrial septum was not well visualized.  LEFT VENTRICLE PLAX 2D LVIDd:         5.20 cm   Diastology LVIDs:         3.70 cm   LV e' medial:    7.28 cm/s LV PW:         1.00 cm   LV E/e' medial:  8.1 LV IVS:        1.30 cm   LV e' lateral:   7.58 cm/s LVOT diam:     2.10 cm   LV E/e' lateral: 7.8 LV SV:         81 LV SV Index:   36 LVOT Area:     3.46 cm  RIGHT VENTRICLE RV Basal diam:  3.70 cm LEFT ATRIUM             Index        RIGHT ATRIUM           Index LA diam:        4.00 cm 1.76 cm/m   RA Area:     27.20 cm LA Vol (A2C):   72.0 ml 31.68 ml/m  RA Volume:   90.60 ml  39.86 ml/m LA Vol (A4C):   93.0 ml 40.92 ml/m LA Biplane Vol: 81.7 ml 35.95 ml/m  AORTIC VALVE AV Area (Vmax):    2.73 cm AV Area (Vmean):   2.68 cm AV Area (VTI):     2.69 cm AV Vmax:           136.00 cm/s AV Vmean:           90.600 cm/s AV VTI:  0.303 m AV Peak Grad:      7.4 mmHg AV Mean Grad:      4.0 mmHg LVOT Vmax:         107.00 cm/s LVOT Vmean:        70.200 cm/s LVOT VTI:          0.235 m LVOT/AV VTI ratio: 0.78  AORTA Ao Root diam: 3.90 cm MITRAL VALVE MV Area (PHT): 3.30 cm    SHUNTS MV Decel Time: 230 msec    Systemic VTI:  0.24 m MV E velocity: 59.20 cm/s  Systemic Diam: 2.10 cm MV A velocity: 85.70 cm/s MV E/A ratio:  0.69 Oswaldo Milian MD Electronically signed by Oswaldo Milian MD Signature Date/Time: 06/19/2021/6:17:11 PM    Final    CT HEAD CODE STROKE WO CONTRAST  Addendum Date: 06/19/2021   ADDENDUM REPORT: 06/19/2021 12:34 ADDENDUM: Please note, there is a dictation error in the fifth line of the brain findings section. This should read there is no evidence of ACUTE demarcated cortical infarct. Electronically Signed   By: Kellie Simmering D.O.   On: 06/19/2021 12:34   Result Date: 06/19/2021 CLINICAL DATA:  Code stroke. Neuro deficit, acute, stroke suspected. EXAM: CT HEAD WITHOUT CONTRAST TECHNIQUE: Contiguous axial images were obtained from the base of the skull through the vertex without intravenous contrast. RADIATION DOSE REDUCTION: This exam was performed according to the departmental dose-optimization program which includes automated exposure control, adjustment of the mA and/or kV according to patient size and/or use of iterative reconstruction technique. COMPARISON:  No pertinent prior exams available for comparison. FINDINGS: Brain: Mild-to-moderate generalized cerebral atrophy. 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 Small chronic appearing infarct within the left cerebellar hemisphere. There is no acute intracranial hemorrhage. No demarcated cortical infarct. No extra-axial fluid collection. No evidence of an intracranial mass. No midline shift. Vascular: No hyperdense vessel.  Atherosclerotic calcifications. Skull: Normal. Negative for fracture  or focal lesion. Sinuses/Orbits: Visualized orbits show no acute finding. No significant paranasal sinus disease at the imaged levels. ASPECTS ([place] Stroke Program Early CT Score) - Ganglionic level infarction (caudate, lentiform nuclei, internal capsule, insula, M1-M3 cortex): 7 - Supraganglionic infarction (M4-M6 cortex): 3 Total score (0-10 with 10 being normal): 10 (when discounting the chronic infarcts) No evidence of acute intracranial mallet. These results were communicated to Dr. Lorrin Goodell at 12:28 pmon 3/4/2023by text page via the [facility] messaging system. IMPRESSION: No evidence of acute intracranial abnormality. 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 Small chronic appearing infarct within the left cerebellar hemisphere. Mild-to-moderate generalized cerebral atrophy. Electronically Signed: By: Kellie Simmering D.O. On: 06/19/2021 12:28   VAS US CAROTID  Result Date: 06/19/2021 Carotid Arterial Duplex Study Patient Name:  IBN STIEF  Date of Exam:   06/19/2021 Medical Rec #: [id number]       Accession #:    [id number] Date of Birth: [birth year]      Patient Gender: M Patient Age:   27 years Exam Location:  [facility] Procedure:      VAS US CAROTID Referring Phys: Fuller Plan --------------------------------------------------------------------------------  Indications:       TIA. Risk Factors:      Hypertension, hyperlipidemia. Comparison Study:  No prior studies. Performing Technologist: Oliver Hum RVT  Examination Guidelines: A complete evaluation includes B-mode imaging, spectral Doppler, color Doppler, and power Doppler as needed of all accessible portions of each vessel. Bilateral testing is considered an integral part of a complete examination.  Limited examinations for reoccurring indications may be performed as noted.  Right Carotid Findings: +----------+--------+--------+--------+-----------------------+--------+             PSV cm/s EDV  cm/s Stenosis Plaque Description      Comments  +----------+--------+--------+--------+-----------------------+--------+  CCA Prox   85       11                                                  +----------+--------+--------+--------+-----------------------+--------+  CCA Distal 95       14                smooth and heterogenous           +----------+--------+--------+--------+-----------------------+--------+  ICA Prox   71       11                smooth and heterogenous           +----------+--------+--------+--------+-----------------------+--------+  ICA Distal 60       14                                        tortuous  +----------+--------+--------+--------+-----------------------+--------+  ECA        116      12                                                  +----------+--------+--------+--------+-----------------------+--------+ +----------+--------+-------+--------+-------------------+             PSV cm/s EDV cms Describe Arm Pressure (mmHG)  +----------+--------+-------+--------+-------------------+  Subclavian 126                                            +----------+--------+-------+--------+-------------------+ +---------+--------+--+--------+-+---------+  Vertebral PSV cm/s 34 EDV cm/s 7 Antegrade  +---------+--------+--+--------+-+---------+  Left Carotid Findings: +----------+--------+--------+--------+-----------------------+--------+             PSV cm/s EDV cm/s Stenosis Plaque Description      Comments  +----------+--------+--------+--------+-----------------------+--------+  CCA Prox   95       10                smooth and heterogenous           +----------+--------+--------+--------+-----------------------+--------+  CCA Distal 80       9                 smooth and heterogenous           +----------+--------+--------+--------+-----------------------+--------+  ICA Prox   92       20                smooth and heterogenous            +----------+--------+--------+--------+-----------------------+--------+  ICA Distal 49       9                                         tortuous  +----------+--------+--------+--------+-----------------------+--------+  ECA        125      8                                                   +----------+--------+--------+--------+-----------------------+--------+ +----------+--------+--------+--------+-------------------+             PSV cm/s EDV cm/s Describe Arm Pressure (mmHG)  +----------+--------+--------+--------+-------------------+  Subclavian 124                                             +----------+--------+--------+--------+-------------------+ +---------+--------+--+--------+-+---------+  Vertebral PSV cm/s 34 EDV cm/s 8 Antegrade  +---------+--------+--+--------+-+---------+   Summary: Right Carotid: Velocities in the right ICA are consistent with a 1-39% stenosis. Left Carotid: Velocities in the left ICA are consistent with a 1-39% stenosis. Vertebrals: Bilateral vertebral arteries demonstrate antegrade flow. *See table(s) above for measurements and observations.     Preliminary      Subjective: Patient seen examined bedside, resting comfortably.  No specific complaints this morning.  Symptoms remain completely resolved.  Daughter present at bedside.  Wishes to go home this morning.  Discussed with neurology, Dr. Erlinda Hong; can discharge home on 3 weeks dual antiplatelet therapy followed by Plavix alone.  Also will need close follow-up outpatient with neurology and repeat MRA likely in 1 year for finding of small left ICA aneurysm.  Patient denies headache, no visual changes, no chest pain, palpitations, no fever/chills/night sweats, no nausea/vomiting/diarrhea, no abdominal pain, no weakness, no gait disturbance, no fatigue, no paresthesias.  No acute events overnight per nursing staff.  Discharge Exam: Vitals:   06/20/21 0058 06/20/21 0339  BP: 114/71 110/68  Pulse: 61 65  Resp: 16 20  Temp:  97.9 F (36.6 C) 98 F (36.7 C)  SpO2: 95% 95%   Vitals:   06/19/21 1905 06/19/21 2059 06/20/21 0058 06/20/21 0339  BP: 131/67 123/87 114/71 110/68  Pulse: 75 66 61 65  Resp: '19 18 16 20  '$ Temp:  98.2 F (36.8 C) 97.9 F (36.6 C) 98 F (36.7 C)  TempSrc:  Oral Oral Oral  SpO2: 96% 100% 95% 95%  Weight:      Height:        Physical Exam: GEN: NAD, alert and oriented x 3, obese HEENT: NCAT, PERRL, EOMI, sclera clear, MMM PULM: CTAB w/o wheezes/crackles, normal respiratory effort CV: RRR w/o M/G/R GI: abd soft, NTND, NABS, no R/G/M MSK: no peripheral edema, muscle strength globally intact 5/5 bilateral upper/lower extremities NEURO: CN II-XII intact, no focal deficits, sensation to light touch intact PSYCH: normal mood/affect Integumentary: dry/intact, no rashes or wounds    The results of significant diagnostics from this hospitalization (including imaging, microbiology, ancillary and laboratory) are listed below for reference.     Microbiology: Recent Results (from the past 240 hour(s))  Resp Panel by RT-PCR (Flu A&B, Covid) Nasopharyngeal Swab     Status: None   Collection Time: 06/19/21  3:31 PM   Specimen: Nasopharyngeal Swab; Nasopharyngeal(NP) swabs in vial transport medium  Result Value Ref Range Status   SARS Coronavirus 2 by RT PCR NEGATIVE NEGATIVE Final    Comment: (NOTE) SARS-CoV-2 target nucleic acids are NOT DETECTED.  The SARS-CoV-2 RNA is generally detectable in upper respiratory specimens during the acute  phase of infection. The lowest concentration of SARS-CoV-2 viral copies this assay can detect is 138 copies/mL. A negative result does not preclude SARS-Cov-2 infection and should not be used as the sole basis for treatment or other patient management decisions. A negative result may occur with  improper specimen collection/handling, submission of specimen other than nasopharyngeal swab, presence of viral mutation(s) within the areas targeted by  this assay, and inadequate number of viral copies(<138 copies/mL). A negative result must be combined with clinical observations, patient history, and epidemiological information. The expected result is Negative.  Fact Sheet for Patients:  EntrepreneurPulse.com.au  Fact Sheet for Healthcare Providers:  IncredibleEmployment.be  This test is no t yet approved or cleared by the [place] FDA and  has been authorized for detection and/or diagnosis of SARS-CoV-2 by FDA under an Emergency Use Authorization (EUA). This EUA will remain  in effect (meaning this test can be used) for the duration of the COVID-19 declaration under Section 564(b)(1) of the Act, 21 U.S.C.section 360bbb-3(b)(1), unless the authorization is terminated  or revoked sooner.       Influenza A by PCR NEGATIVE NEGATIVE Final   Influenza B by PCR NEGATIVE NEGATIVE Final    Comment: (NOTE) The Xpert Xpress SARS-CoV-2/FLU/RSV plus assay is intended as an aid in the diagnosis of influenza from Nasopharyngeal swab specimens and should not be used as a sole basis for treatment. Nasal washings and aspirates are unacceptable for Xpert Xpress SARS-CoV-2/FLU/RSV testing.  Fact Sheet for Patients: EntrepreneurPulse.com.au  Fact Sheet for Healthcare Providers: IncredibleEmployment.be  This test is not yet approved or cleared by the [place] FDA and has been authorized for detection and/or diagnosis of SARS-CoV-2 by FDA under an Emergency Use Authorization (EUA). This EUA will remain in effect (meaning this test can be used) for the duration of the COVID-19 declaration under Section 564(b)(1) of the Act, 21 U.S.C. section 360bbb-3(b)(1), unless the authorization is terminated or revoked.  Performed at [facility], [place] [address], [place], [place] [address]      Labs: BNP (last 3 results) No results for input(s): BNP in the last  8760 hours. Basic Metabolic Panel: Recent Labs  Lab 06/19/21 1155 06/19/21 1159  NA 137 139  K 3.9 4.0  CL 101 101  CO2 27  --   GLUCOSE 182* 175*  BUN 16 17  CREATININE 0.93 0.90  CALCIUM 8.8*  --    Liver Function Tests: Recent Labs  Lab 06/19/21 1155  AST 21  ALT 20  ALKPHOS 95  BILITOT 0.7  PROT 7.0  ALBUMIN 3.3*   No results for input(s): LIPASE, AMYLASE in the last 168 hours. No results for input(s): AMMONIA in the last 168 hours. CBC: Recent Labs  Lab 06/19/21 1155 06/19/21 1159  WBC 8.5  --   NEUTROABS 5.6  --   HGB 13.8 15.0  HCT 43.8 44.0  MCV 94.0  --   PLT 224  --    Cardiac Enzymes: No results for input(s): CKTOTAL, CKMB, CKMBINDEX, TROPONINI in the last 168 hours. BNP: Invalid input(s): POCBNP CBG: Recent Labs  Lab 06/19/21 1149  GLUCAP 186*   D-Dimer No results for input(s): DDIMER in the last 72 hours. Hgb A1c Recent Labs    06/19/21 1534  HGBA1C 5.8*   Lipid Profile Recent Labs    06/19/21 1213  CHOL 140  HDL 34*  LDLCALC 58  TRIG 242*  CHOLHDL 4.1   Thyroid function studies No results for input(s): TSH,  T4TOTAL, T3FREE, THYROIDAB in the last 72 hours.  Invalid input(s): FREET3 Anemia work up No results for input(s): VITAMINB12, FOLATE, FERRITIN, TIBC, IRON, RETICCTPCT in the last 72 hours. Urinalysis No results found for: COLORURINE, APPEARANCEUR, [place], [place], GLUCOSEU, [place], [place], [place], PROTEINUR, UROBILINOGEN, NITRITE, LEUKOCYTESUR Sepsis Labs Invalid input(s): PROCALCITONIN,  WBC,  LACTICIDVEN Microbiology Recent Results (from the past 240 hour(s))  Resp Panel by RT-PCR (Flu A&B, Covid) Nasopharyngeal Swab     Status: None   Collection Time: 06/19/21  3:31 PM   Specimen: Nasopharyngeal Swab; Nasopharyngeal(NP) swabs in vial transport medium  Result Value Ref Range Status   SARS Coronavirus 2 by RT PCR NEGATIVE NEGATIVE Final    Comment: (NOTE) SARS-CoV-2 target nucleic acids are NOT  DETECTED.  The SARS-CoV-2 RNA is generally detectable in upper respiratory specimens during the acute phase of infection. The lowest concentration of SARS-CoV-2 viral copies this assay can detect is 138 copies/mL. A negative result does not preclude SARS-Cov-2 infection and should not be used as the sole basis for treatment or other patient management decisions. A negative result may occur with  improper specimen collection/handling, submission of specimen other than nasopharyngeal swab, presence of viral mutation(s) within the areas targeted by this assay, and inadequate number of viral copies(<138 copies/mL). A negative result must be combined with clinical observations, patient history, and epidemiological information. The expected result is Negative.  Fact Sheet for Patients:  EntrepreneurPulse.com.au  Fact Sheet for Healthcare Providers:  IncredibleEmployment.be  This test is no t yet approved or cleared by the [place] FDA and  has been authorized for detection and/or diagnosis of SARS-CoV-2 by FDA under an Emergency Use Authorization (EUA). This EUA will remain  in effect (meaning this test can be used) for the duration of the COVID-19 declaration under Section 564(b)(1) of the Act, 21 U.S.C.section 360bbb-3(b)(1), unless the authorization is terminated  or revoked sooner.       Influenza A by PCR NEGATIVE NEGATIVE Final   Influenza B by PCR NEGATIVE NEGATIVE Final    Comment: (NOTE) The Xpert Xpress SARS-CoV-2/FLU/RSV plus assay is intended as an aid in the diagnosis of influenza from Nasopharyngeal swab specimens and should not be used as a sole basis for treatment. Nasal washings and aspirates are unacceptable for Xpert Xpress SARS-CoV-2/FLU/RSV testing.  Fact Sheet for Patients: EntrepreneurPulse.com.au  Fact Sheet for Healthcare Providers: IncredibleEmployment.be  This test is not yet  approved or cleared by the [place] FDA and has been authorized for detection and/or diagnosis of SARS-CoV-2 by FDA under an Emergency Use Authorization (EUA). This EUA will remain in effect (meaning this test can be used) for the duration of the COVID-19 declaration under Section 564(b)(1) of the Act, 21 U.S.C. section 360bbb-3(b)(1), unless the authorization is terminated or revoked.  Performed at [facility], [place] [address] [place], [place] [address]      Time coordinating discharge: Over 30 minutes  SIGNED:   Cambry Spampinato J [place] DO  [facility] 06/20/2021, 10:05 AM

## 2021-06-20 NOTE — Evaluation (Signed)
Occupational Therapy Evaluation ?Patient Details ?Name: Caleb Johnson ?MRN: [id number] ?DOB: 03/19/1941 ?Today's Date: 06/20/2021 ? ? ?History of Present Illness 81 y.o. male who presents 06/19/21 with reports of numbness and tingling of his left arm and leg. CT scan of the brain did not note any acute abnormality, but did note a small chronic appearing of the right parietal lobe and the left cerebellar hemisphere. MRI negative for acute changes; ? small aneurysm Lt ICA.   PMH significant of hypertension, hyperlipidemia, CAD with prior history of MI in 1995, diabetes mellitus type 2,prior tobacco abuse, and morbid obesity  ? ?Clinical Impression ?  ?Pt admitted for concerns listed above. PTA pt reported that he was independent with all ADL's and IADL's, using no DME. At this time, pt is independent with all BADL's and functional mobility. Pt presents WFL with his vision and cognition. He has no further OT needs at this time and acute OT will sign off.   ?   ? ?Recommendations for follow up therapy are one component of a multi-disciplinary discharge planning process, led by the attending physician.  Recommendations may be updated based on patient status, additional functional criteria and insurance authorization.  ? ?Follow Up Recommendations ? No OT follow up  ?  ?Assistance Recommended at Discharge None  ?Patient can return home with the following   ? ?  ?Functional Status Assessment ? Patient has had a recent decline in their functional status and demonstrates the ability to make significant improvements in function in a reasonable and predictable amount of time.  ?Equipment Recommendations ? None recommended by OT  ?  ?Recommendations for Other Services   ? ? ?  ?Precautions / Restrictions Precautions ?Precautions: None ?Restrictions ?Weight Bearing Restrictions: No  ? ?  ? ?Mobility Bed Mobility ?Overal bed mobility: Modified Independent ?  ?  ?  ?  ?  ?  ?General bed mobility comments: HOB elevated slightly, no  assist ?  ? ?Transfers ?Overall transfer level: Modified independent ?Equipment used: None ?  ?  ?  ?  ?  ?  ?  ?General transfer comment: No difficulties, pt moving well ?  ? ?  ?Balance Overall balance assessment: No apparent balance deficits (not formally assessed) ?  ?  ?  ?  ?  ?  ?  ?  ?  ?  ?  ?  ?  ?  ?  ?  ?  ?  ?   ? ?ADL either performed or assessed with clinical judgement  ? ?ADL Overall ADL's : At baseline;Modified independent ?  ?  ?  ?  ?  ?  ?  ?  ?  ?  ?  ?  ?  ?  ?  ?  ?  ?  ?  ?General ADL Comments: Pt able to complete all BADL's with no assist, safely. No concerns with functional mobility or ADL performance  ? ? ? ?Vision Baseline Vision/History: 1 Wears glasses;4 Cataracts ?Ability to See in Adequate Light: 0 Adequate ?Patient Visual Report: No change from baseline ?Vision Assessment?: No apparent visual deficits ?Additional Comments: Tracking, saccades, convergence, peripherals, and functional acuity WFL  ?   ?Perception   ?  ?Praxis   ?  ? ?Pertinent Vitals/Pain Pain Assessment ?Pain Assessment: No/denies pain  ? ? ? ?Hand Dominance Right ?  ?Extremity/Trunk Assessment Upper Extremity Assessment ?Upper Extremity Assessment: Overall WFL for tasks assessed ?  ?Lower Extremity Assessment ?Lower Extremity Assessment: Defer to PT evaluation ?  ?  Cervical / Trunk Assessment ?Cervical / Trunk Assessment: Normal ?  ?Communication Communication ?Communication: No difficulties ?  ?Cognition Arousal/Alertness: Awake/alert ?Behavior During Therapy: [facility] for tasks assessed/performed ?Overall Cognitive Status: Within Functional Limits for tasks assessed ?  ?  ?  ?  ?  ?  ?  ?  ?  ?  ?  ?  ?  ?  ?  ?  ?  ?  ?  ?General Comments  VSS on RA ? ?  ?Exercises   ?  ?Shoulder Instructions    ? ? ?Home Living Family/patient expects to be discharged to:: Private residence ?Living Arrangements: Other relatives Yolanda Bonine) ?Available Help at Discharge: Family ?Type of Home: House ?Home Access: Level entry ?  ?  ?Home  Layout: One level ?  ?  ?Bathroom Shower/Tub: Walk-in shower ?  ?Bathroom Toilet: Standard ?  ?  ?Home Equipment: None ?  ?  ?  ? ?  ?Prior Functioning/Environment Prior Level of Function : Independent/Modified Independent;Driving ?  ?  ?  ?  ?  ?  ?  ?  ?  ? ?  ?  ?OT Problem List: Decreased activity tolerance;Impaired balance (sitting and/or standing) ?  ?   ?OT Treatment/Interventions:    ?  ?OT Goals(Current goals can be found in the care plan section) Acute Rehab OT Goals ?Patient Stated Goal: To go home ?OT Goal Formulation: All assessment and education complete, DC therapy ?Time For Goal Achievement: 06/20/21 ?Potential to Achieve Goals: Good  ?OT Frequency:   ?  ? ?Co-evaluation   ?  ?  ?  ?  ? ?  ?AM-PAC OT "6 Clicks" Daily Activity     ?Outcome Measure Help from another person eating meals?: None ?Help from another person taking care of personal grooming?: None ?Help from another person toileting, which includes using toliet, bedpan, or urinal?: None ?Help from another person bathing (including washing, rinsing, drying)?: None ?Help from another person to put on and taking off regular upper body clothing?: None ?Help from another person to put on and taking off regular lower body clothing?: None ?6 Click Score: 24 ?  ?End of Session Nurse Communication: Mobility status ? ?Activity Tolerance: Patient tolerated treatment well ?Patient left: in bed;with call bell/phone within reach ? ?OT Visit Diagnosis: Unsteadiness on feet (R26.81);Other abnormalities of gait and mobility (R26.89);Muscle weakness (generalized) (M62.81)  ?              ?Time: 2751-7001 ?OT Time Calculation (min): 10 min ?Charges:  OT General Charges ?$OT Visit: 1 Visit ?OT Evaluation ?$OT Eval Moderate Complexity: 1 Mod ? ?Momodou Consiglio H., OTR/L ?Acute Rehabilitation ? ?Sarim Rothman Elane Yolanda Bonine ?06/20/2021, 9:24 AM ?

## 2021-06-22 DIAGNOSIS — G319 Degenerative disease of nervous system, unspecified: Secondary | ICD-10-CM | POA: Diagnosis not present

## 2021-06-22 DIAGNOSIS — G459 Transient cerebral ischemic attack, unspecified: Secondary | ICD-10-CM | POA: Diagnosis not present

## 2021-06-22 DIAGNOSIS — I10 Essential (primary) hypertension: Secondary | ICD-10-CM | POA: Diagnosis not present

## 2021-06-22 DIAGNOSIS — I671 Cerebral aneurysm, nonruptured: Secondary | ICD-10-CM | POA: Diagnosis not present

## 2021-06-22 DIAGNOSIS — I6789 Other cerebrovascular disease: Secondary | ICD-10-CM | POA: Diagnosis not present

## 2021-06-22 DIAGNOSIS — Z1389 Encounter for screening for other disorder: Secondary | ICD-10-CM | POA: Diagnosis not present

## 2021-06-22 DIAGNOSIS — E1169 Type 2 diabetes mellitus with other specified complication: Secondary | ICD-10-CM | POA: Diagnosis not present

## 2021-06-22 DIAGNOSIS — E7800 Pure hypercholesterolemia, unspecified: Secondary | ICD-10-CM | POA: Diagnosis not present

## 2021-06-22 DIAGNOSIS — I2510 Atherosclerotic heart disease of native coronary artery without angina pectoris: Secondary | ICD-10-CM | POA: Diagnosis not present

## 2021-06-22 DIAGNOSIS — Z0000 Encounter for general adult medical examination without abnormal findings: Secondary | ICD-10-CM | POA: Diagnosis not present

## 2021-07-14 DIAGNOSIS — H25812 Combined forms of age-related cataract, left eye: Secondary | ICD-10-CM | POA: Diagnosis not present

## 2021-07-14 DIAGNOSIS — H2512 Age-related nuclear cataract, left eye: Secondary | ICD-10-CM | POA: Diagnosis not present

## 2021-08-05 ENCOUNTER — Inpatient Hospital Stay: Payer: Self-pay | Admitting: Adult Health

## 2021-12-23 DIAGNOSIS — I671 Cerebral aneurysm, nonruptured: Secondary | ICD-10-CM | POA: Diagnosis not present

## 2021-12-23 DIAGNOSIS — E1169 Type 2 diabetes mellitus with other specified complication: Secondary | ICD-10-CM | POA: Diagnosis not present

## 2021-12-23 DIAGNOSIS — E7800 Pure hypercholesterolemia, unspecified: Secondary | ICD-10-CM | POA: Diagnosis not present

## 2021-12-23 DIAGNOSIS — G459 Transient cerebral ischemic attack, unspecified: Secondary | ICD-10-CM | POA: Diagnosis not present

## 2022-02-25 DIAGNOSIS — H5310 Unspecified subjective visual disturbances: Secondary | ICD-10-CM | POA: Diagnosis not present

## 2022-03-08 DIAGNOSIS — H401131 Primary open-angle glaucoma, bilateral, mild stage: Secondary | ICD-10-CM | POA: Diagnosis not present

## 2022-05-13 DIAGNOSIS — H401131 Primary open-angle glaucoma, bilateral, mild stage: Secondary | ICD-10-CM | POA: Diagnosis not present

## 2022-05-13 DIAGNOSIS — Z961 Presence of intraocular lens: Secondary | ICD-10-CM | POA: Diagnosis not present

## 2022-06-27 DIAGNOSIS — Z1331 Encounter for screening for depression: Secondary | ICD-10-CM | POA: Diagnosis not present

## 2022-06-27 DIAGNOSIS — R930 Abnormal findings on diagnostic imaging of skull and head, not elsewhere classified: Secondary | ICD-10-CM | POA: Diagnosis not present

## 2022-06-27 DIAGNOSIS — E1169 Type 2 diabetes mellitus with other specified complication: Secondary | ICD-10-CM | POA: Diagnosis not present

## 2022-06-27 DIAGNOSIS — I10 Essential (primary) hypertension: Secondary | ICD-10-CM | POA: Diagnosis not present

## 2022-06-27 DIAGNOSIS — Z0000 Encounter for general adult medical examination without abnormal findings: Secondary | ICD-10-CM | POA: Diagnosis not present

## 2022-06-27 DIAGNOSIS — E663 Overweight: Secondary | ICD-10-CM | POA: Diagnosis not present

## 2022-06-27 DIAGNOSIS — E7800 Pure hypercholesterolemia, unspecified: Secondary | ICD-10-CM | POA: Diagnosis not present

## 2022-06-27 DIAGNOSIS — G319 Degenerative disease of nervous system, unspecified: Secondary | ICD-10-CM | POA: Diagnosis not present

## 2022-09-16 DIAGNOSIS — Z961 Presence of intraocular lens: Secondary | ICD-10-CM | POA: Diagnosis not present

## 2022-09-16 DIAGNOSIS — H401131 Primary open-angle glaucoma, bilateral, mild stage: Secondary | ICD-10-CM | POA: Diagnosis not present

## 2022-12-26 ENCOUNTER — Other Ambulatory Visit: Payer: Self-pay | Admitting: Internal Medicine

## 2022-12-26 ENCOUNTER — Ambulatory Visit
Admission: RE | Admit: 2022-12-26 | Discharge: 2022-12-26 | Disposition: A | Discharge status: Home / Self Care | Payer: Medicare Other | Source: Ambulatory Visit | Attending: Internal Medicine | Admitting: Internal Medicine

## 2022-12-26 DIAGNOSIS — I6789 Other cerebrovascular disease: Secondary | ICD-10-CM | POA: Diagnosis not present

## 2022-12-26 DIAGNOSIS — Z23 Encounter for immunization: Secondary | ICD-10-CM | POA: Diagnosis not present

## 2022-12-26 DIAGNOSIS — R0989 Other specified symptoms and signs involving the circulatory and respiratory systems: Secondary | ICD-10-CM

## 2022-12-26 DIAGNOSIS — E7800 Pure hypercholesterolemia, unspecified: Secondary | ICD-10-CM | POA: Diagnosis not present

## 2022-12-26 DIAGNOSIS — E1169 Type 2 diabetes mellitus with other specified complication: Secondary | ICD-10-CM | POA: Diagnosis not present

## 2022-12-26 DIAGNOSIS — K449 Diaphragmatic hernia without obstruction or gangrene: Secondary | ICD-10-CM | POA: Diagnosis not present

## 2022-12-26 DIAGNOSIS — R918 Other nonspecific abnormal finding of lung field: Secondary | ICD-10-CM | POA: Diagnosis not present

## 2022-12-26 DIAGNOSIS — I671 Cerebral aneurysm, nonruptured: Secondary | ICD-10-CM | POA: Diagnosis not present

## 2022-12-26 DIAGNOSIS — I10 Essential (primary) hypertension: Secondary | ICD-10-CM | POA: Diagnosis not present

## 2022-12-26 DIAGNOSIS — G459 Transient cerebral ischemic attack, unspecified: Secondary | ICD-10-CM | POA: Diagnosis not present

## 2023-01-06 ENCOUNTER — Other Ambulatory Visit: Payer: Self-pay | Admitting: Internal Medicine

## 2023-01-06 ENCOUNTER — Encounter: Payer: Self-pay | Admitting: Internal Medicine

## 2023-01-06 DIAGNOSIS — R9389 Abnormal findings on diagnostic imaging of other specified body structures: Secondary | ICD-10-CM

## 2023-01-10 DIAGNOSIS — L570 Actinic keratosis: Secondary | ICD-10-CM | POA: Diagnosis not present

## 2023-01-10 DIAGNOSIS — L821 Other seborrheic keratosis: Secondary | ICD-10-CM | POA: Diagnosis not present

## 2023-01-10 DIAGNOSIS — L218 Other seborrheic dermatitis: Secondary | ICD-10-CM | POA: Diagnosis not present

## 2023-01-13 DIAGNOSIS — Z961 Presence of intraocular lens: Secondary | ICD-10-CM | POA: Diagnosis not present

## 2023-01-13 DIAGNOSIS — H401131 Primary open-angle glaucoma, bilateral, mild stage: Secondary | ICD-10-CM | POA: Diagnosis not present

## 2023-01-24 ENCOUNTER — Ambulatory Visit
Admission: RE | Admit: 2023-01-24 | Discharge: 2023-01-24 | Disposition: A | Discharge status: Home / Self Care | Payer: Medicare Other | Source: Ambulatory Visit | Attending: Internal Medicine | Admitting: Internal Medicine

## 2023-01-24 DIAGNOSIS — J439 Emphysema, unspecified: Secondary | ICD-10-CM | POA: Diagnosis not present

## 2023-01-24 DIAGNOSIS — I700 Atherosclerosis of aorta: Secondary | ICD-10-CM | POA: Diagnosis not present

## 2023-01-24 DIAGNOSIS — J479 Bronchiectasis, uncomplicated: Secondary | ICD-10-CM | POA: Diagnosis not present

## 2023-01-24 DIAGNOSIS — R9389 Abnormal findings on diagnostic imaging of other specified body structures: Secondary | ICD-10-CM

## 2023-04-21 DIAGNOSIS — H401131 Primary open-angle glaucoma, bilateral, mild stage: Secondary | ICD-10-CM | POA: Diagnosis not present

## 2023-07-03 DIAGNOSIS — G459 Transient cerebral ischemic attack, unspecified: Secondary | ICD-10-CM | POA: Diagnosis not present

## 2023-07-03 DIAGNOSIS — I6789 Other cerebrovascular disease: Secondary | ICD-10-CM | POA: Diagnosis not present

## 2023-07-03 DIAGNOSIS — J849 Interstitial pulmonary disease, unspecified: Secondary | ICD-10-CM | POA: Diagnosis not present

## 2023-07-03 DIAGNOSIS — I10 Essential (primary) hypertension: Secondary | ICD-10-CM | POA: Diagnosis not present

## 2023-07-03 DIAGNOSIS — E1169 Type 2 diabetes mellitus with other specified complication: Secondary | ICD-10-CM | POA: Diagnosis not present

## 2023-07-03 DIAGNOSIS — Z0000 Encounter for general adult medical examination without abnormal findings: Secondary | ICD-10-CM | POA: Diagnosis not present

## 2023-07-03 DIAGNOSIS — Z1331 Encounter for screening for depression: Secondary | ICD-10-CM | POA: Diagnosis not present

## 2023-07-03 DIAGNOSIS — I700 Atherosclerosis of aorta: Secondary | ICD-10-CM | POA: Diagnosis not present

## 2023-07-03 DIAGNOSIS — I671 Cerebral aneurysm, nonruptured: Secondary | ICD-10-CM | POA: Diagnosis not present

## 2023-07-03 DIAGNOSIS — I2510 Atherosclerotic heart disease of native coronary artery without angina pectoris: Secondary | ICD-10-CM | POA: Diagnosis not present

## 2023-07-03 DIAGNOSIS — E7800 Pure hypercholesterolemia, unspecified: Secondary | ICD-10-CM | POA: Diagnosis not present

## 2023-07-11 DIAGNOSIS — D2261 Melanocytic nevi of right upper limb, including shoulder: Secondary | ICD-10-CM | POA: Diagnosis not present

## 2023-07-11 DIAGNOSIS — L570 Actinic keratosis: Secondary | ICD-10-CM | POA: Diagnosis not present

## 2023-07-11 DIAGNOSIS — L820 Inflamed seborrheic keratosis: Secondary | ICD-10-CM | POA: Diagnosis not present

## 2023-07-11 DIAGNOSIS — L218 Other seborrheic dermatitis: Secondary | ICD-10-CM | POA: Diagnosis not present

## 2023-07-11 DIAGNOSIS — D2239 Melanocytic nevi of other parts of face: Secondary | ICD-10-CM | POA: Diagnosis not present

## 2023-07-11 DIAGNOSIS — L821 Other seborrheic keratosis: Secondary | ICD-10-CM | POA: Diagnosis not present

## 2023-07-11 DIAGNOSIS — D2262 Melanocytic nevi of left upper limb, including shoulder: Secondary | ICD-10-CM | POA: Diagnosis not present

## 2023-07-25 DIAGNOSIS — H401131 Primary open-angle glaucoma, bilateral, mild stage: Secondary | ICD-10-CM | POA: Diagnosis not present

## 2023-07-25 DIAGNOSIS — Z961 Presence of intraocular lens: Secondary | ICD-10-CM | POA: Diagnosis not present

## 2023-08-23 IMAGING — MR MR HEAD W/O CM
6 of 10 series · 27 of 48 positions shown · non-contrast
Comparison: Non-contrast head CT performed earlier today
06/19/2021.

CLINICAL DATA: Provided history: Neuro deficit, acute, stroke
suspected.

EXAM:
MRI HEAD WITHOUT CONTRAST
TECHNIQUE: Multiplanar, multiecho pulse sequences of the brain and surrounding
structures were obtained without intravenous contrast.

[Series 2: DWI · axial · 3.0mm · 0.94mm/px · z∈[-75,+73]mm · 8 of 106 slices shown (1 of 2)]
[im 1/106]
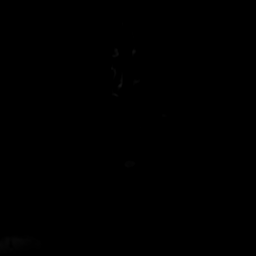
[im 12/106]
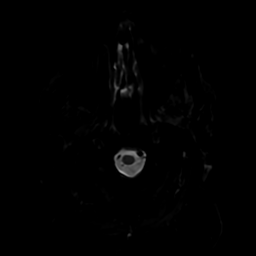
[im 36/106]
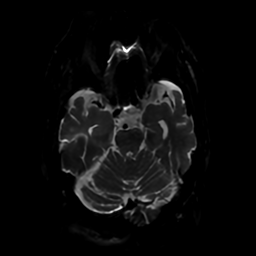
[im 47/106]
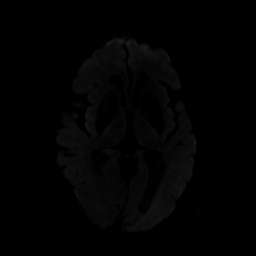
[im 59/106]
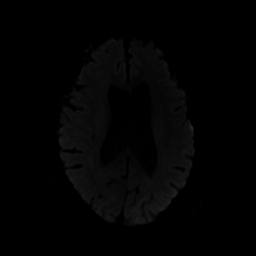
[im 71/106]
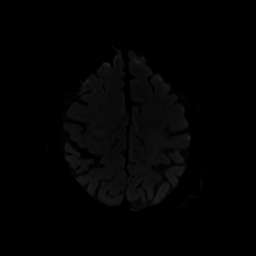
[im 94/106]
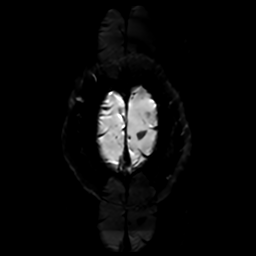
[im 106/106]
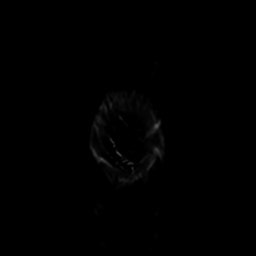

[Series 3: DWI · coronal · 4.0mm · 0.94mm/px · 7 of 82 slices shown (2 of 2)]
[im 1/82]
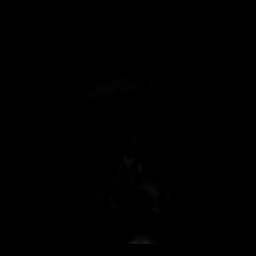
[im 14/82]
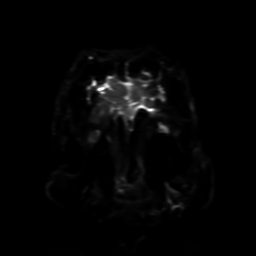
[im 28/82]
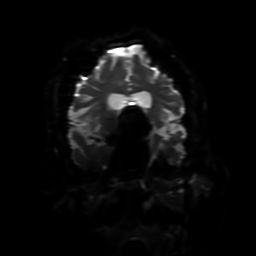
[im 41/82]
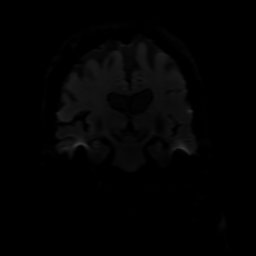
[im 55/82]
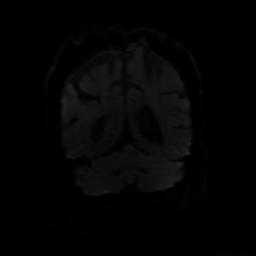
[im 68/82]
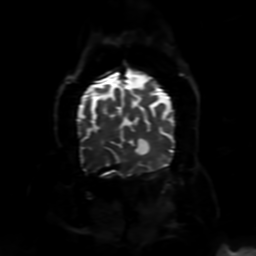
[im 82/82]
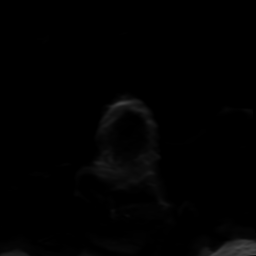

[Series 4: FLAIR · sagittal · 5.0mm · 0.23mm/px · 2 of 26 slices shown (1 of 2)]
[im 1/26]
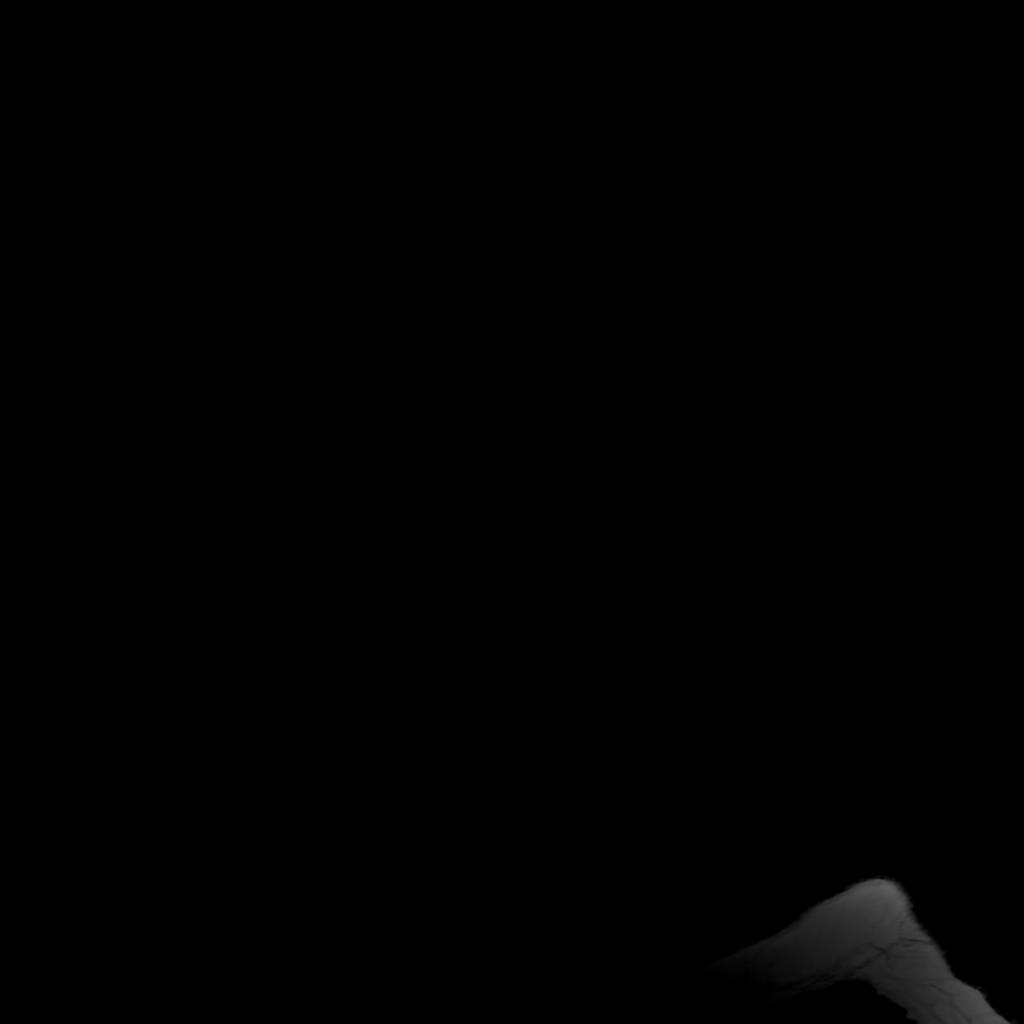
[im 26/26]
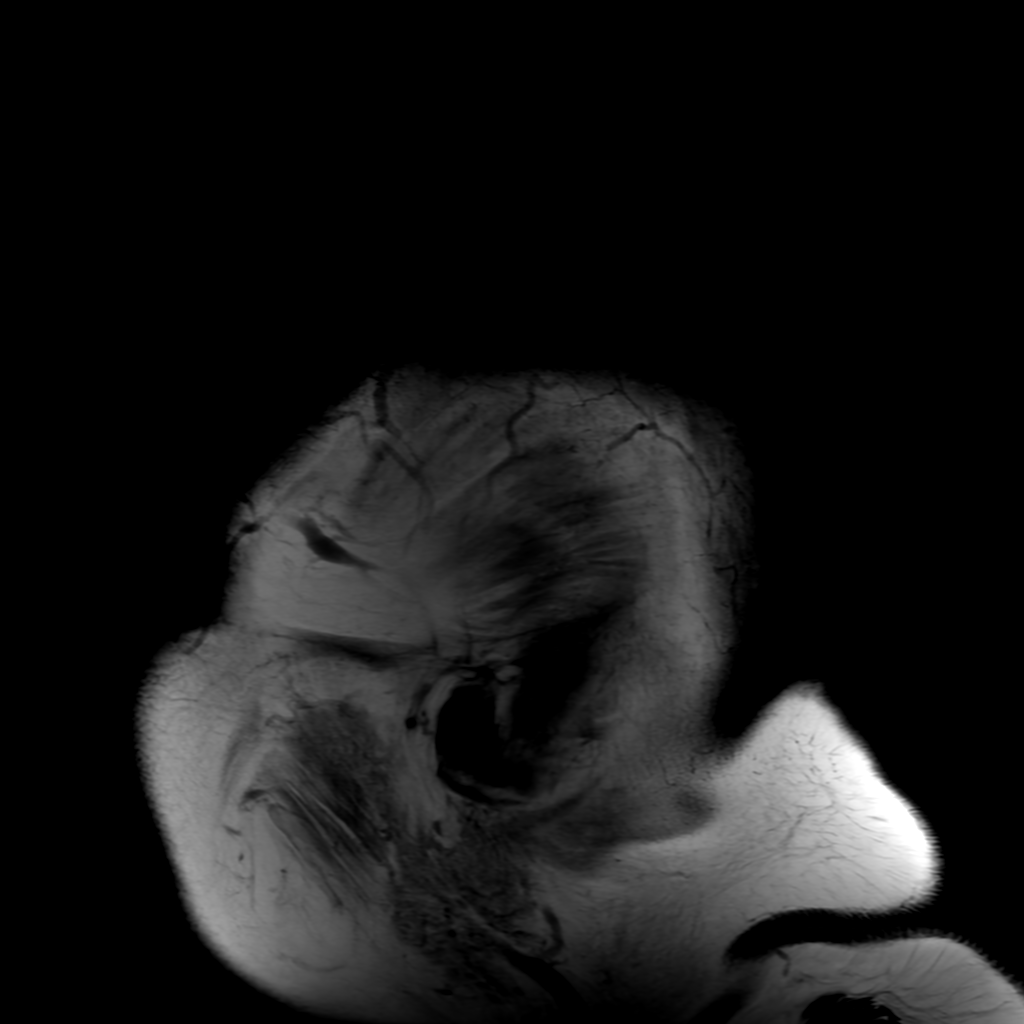

[Series 6: FLAIR · axial · 4.0mm · 0.45mm/px · z∈[-82,+68]mm · 3 of 37 slices shown (2 of 2)]
[im 1/37]
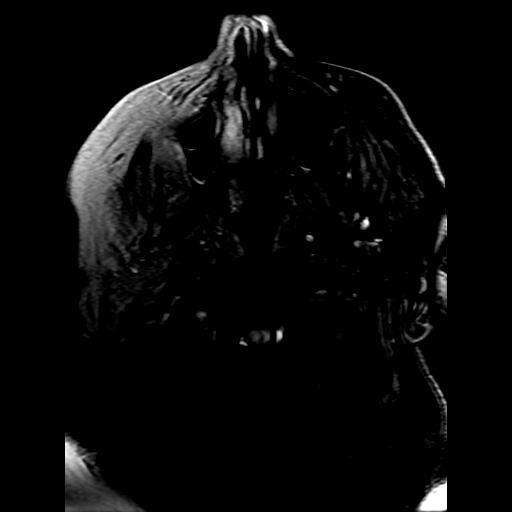
[im 19/37]
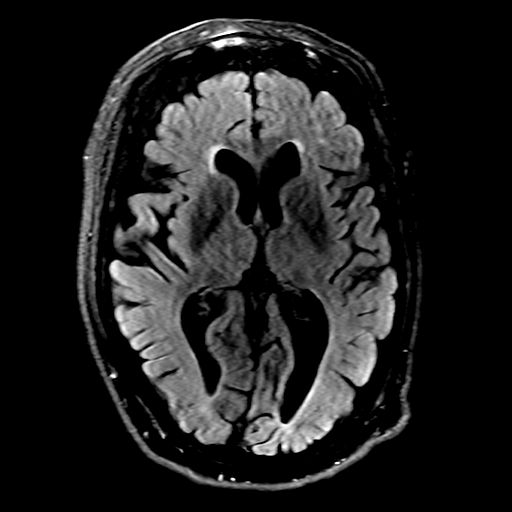
[im 37/37]
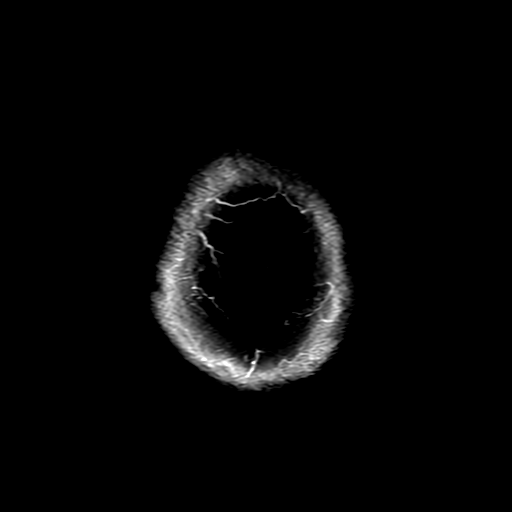

[Series 250: ADC · axial · 3.0mm · 0.94mm/px · z∈[-75,+73]mm · 4 of 52 slices shown (1 of 2)]
[im 1/52]
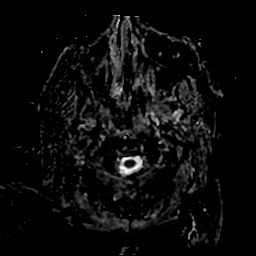
[im 18/52]
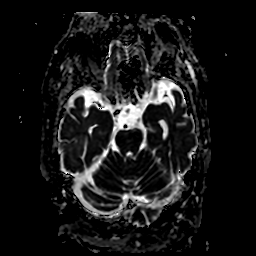
[im 35/52]
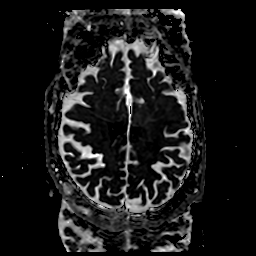
[im 52/52]
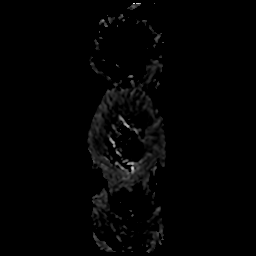

[Series 350: ADC · coronal · 4.0mm · 0.94mm/px · 3 of 41 slices shown (2 of 2)]
[im 1/41]
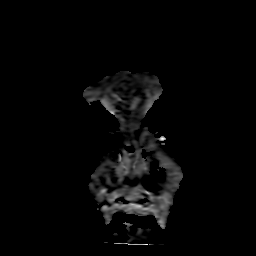
[im 21/41]
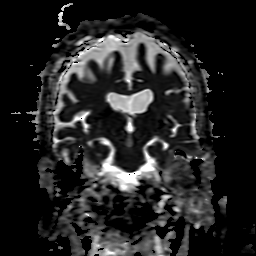
[im 41/41]
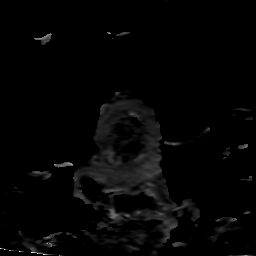

[27 of 48 positions shown; findings below may reference images not displayed]

FINDINGS: Brain:

Intermittently motion degraded examination. Most notably, there is
moderate motion degradation of the coronal T2 TSE sequence.

Mild-to-moderate generalized cerebral atrophy.

Redemonstrated small chronic cortical/subcortical infarct within the
right parietal lobe.

Suspected additional tiny chronic cortical/subcortical infarct
within the mid to anterior right frontal lobe (series 6, image 22).

A few small T2 FLAIR hyperintense foci elsewhere within the cerebral
white matter are nonspecific, but compatible with minimal changes of
chronic small vessel ischemia.

Small chronic infarcts within the bilateral cerebellar hemispheres
(series 5, images 7 and 8).

There is no acute infarct.

No evidence of an intracranial mass.

No chronic intracranial blood products.

No extra-axial fluid collection.

No midline shift.

Vascular: Maintained flow voids within the proximal large arterial
vessels.

Skull and upper cervical spine: No focal suspicious marrow lesion.

Sinuses/Orbits: Visualized orbits show no acute finding. Right
ocular lens replacement. Minimal mucosal thickening within the
bilateral ethmoid sinuses.
IMPRESSION: Intermittently motion degraded exam.

No evidence of acute intracranial abnormality.

Redemonstrated small chronic cortical/subcortical infarct within the
right parietal lobe (right MCA vascular territory).

Suspected additional tiny chronic cortical/subcortical infarct
within the mid-to-anterior right frontal lobe (right MCA vascular
territory).

Background minimal chronic small-vessel ischemic changes within the
cerebral white matter.

Small chronic infarcts within the bilateral cerebellar hemispheres.

Mild-to-moderate generalized cerebral atrophy.

## 2023-11-14 DIAGNOSIS — Z961 Presence of intraocular lens: Secondary | ICD-10-CM | POA: Diagnosis not present

## 2023-11-14 DIAGNOSIS — H401131 Primary open-angle glaucoma, bilateral, mild stage: Secondary | ICD-10-CM | POA: Diagnosis not present

## 2023-11-14 DIAGNOSIS — H04123 Dry eye syndrome of bilateral lacrimal glands: Secondary | ICD-10-CM | POA: Diagnosis not present

## 2024-01-03 DIAGNOSIS — I6789 Other cerebrovascular disease: Secondary | ICD-10-CM | POA: Diagnosis not present

## 2024-01-03 DIAGNOSIS — J849 Interstitial pulmonary disease, unspecified: Secondary | ICD-10-CM | POA: Diagnosis not present

## 2024-01-03 DIAGNOSIS — I2510 Atherosclerotic heart disease of native coronary artery without angina pectoris: Secondary | ICD-10-CM | POA: Diagnosis not present

## 2024-01-03 DIAGNOSIS — I10 Essential (primary) hypertension: Secondary | ICD-10-CM | POA: Diagnosis not present

## 2024-01-03 DIAGNOSIS — I671 Cerebral aneurysm, nonruptured: Secondary | ICD-10-CM | POA: Diagnosis not present

## 2024-01-03 DIAGNOSIS — E1169 Type 2 diabetes mellitus with other specified complication: Secondary | ICD-10-CM | POA: Diagnosis not present

## 2024-01-03 DIAGNOSIS — I700 Atherosclerosis of aorta: Secondary | ICD-10-CM | POA: Diagnosis not present

## 2024-01-03 DIAGNOSIS — E7800 Pure hypercholesterolemia, unspecified: Secondary | ICD-10-CM | POA: Diagnosis not present

## 2024-02-09 DIAGNOSIS — L821 Other seborrheic keratosis: Secondary | ICD-10-CM | POA: Diagnosis not present

## 2024-02-16 DIAGNOSIS — Z961 Presence of intraocular lens: Secondary | ICD-10-CM | POA: Diagnosis not present

## 2024-02-16 DIAGNOSIS — H401132 Primary open-angle glaucoma, bilateral, moderate stage: Secondary | ICD-10-CM | POA: Diagnosis not present
